# Patient Record
Sex: Female | Born: 1950 | Race: Black or African American | Hispanic: No | Marital: Married | Smoking: Former smoker
Health system: Southern US, Community
[De-identification: ages and names within clinical notes are randomized; demographics above are authoritative.]

## PROBLEM LIST (undated history)

## (undated) DIAGNOSIS — E785 Hyperlipidemia, unspecified: Secondary | ICD-10-CM

## (undated) DIAGNOSIS — D179 Benign lipomatous neoplasm, unspecified: Secondary | ICD-10-CM

## (undated) DIAGNOSIS — R9431 Abnormal electrocardiogram [ECG] [EKG]: Secondary | ICD-10-CM

## (undated) DIAGNOSIS — Z860101 Personal history of adenomatous and serrated colon polyps: Secondary | ICD-10-CM

## (undated) DIAGNOSIS — L309 Dermatitis, unspecified: Secondary | ICD-10-CM

## (undated) DIAGNOSIS — M7521 Bicipital tendinitis, right shoulder: Secondary | ICD-10-CM

## (undated) DIAGNOSIS — Z780 Asymptomatic menopausal state: Secondary | ICD-10-CM

## (undated) DIAGNOSIS — R7301 Impaired fasting glucose: Secondary | ICD-10-CM

## (undated) DIAGNOSIS — Z8639 Personal history of other endocrine, nutritional and metabolic disease: Secondary | ICD-10-CM

## (undated) DIAGNOSIS — Z86010 Personal history of colonic polyps: Secondary | ICD-10-CM

## (undated) DIAGNOSIS — R030 Elevated blood-pressure reading, without diagnosis of hypertension: Secondary | ICD-10-CM

## (undated) HISTORY — PX: COLONOSCOPY: SHX174

## (undated) HISTORY — DX: Personal history of adenomatous and serrated colon polyps: Z86.0101

## (undated) HISTORY — DX: Elevated blood-pressure reading, without diagnosis of hypertension: R03.0

## (undated) HISTORY — PX: BUNIONECTOMY: SHX129

## (undated) HISTORY — DX: Hyperlipidemia, unspecified: E78.5

## (undated) HISTORY — DX: Impaired fasting glucose: R73.01

## (undated) HISTORY — PX: TUBAL LIGATION: SHX77

## (undated) HISTORY — DX: Personal history of other endocrine, nutritional and metabolic disease: Z86.39

## (undated) HISTORY — DX: Benign lipomatous neoplasm, unspecified: D17.9

## (undated) HISTORY — DX: Personal history of colonic polyps: Z86.010

## (undated) HISTORY — DX: Dermatitis, unspecified: L30.9

## (undated) HISTORY — DX: Asymptomatic menopausal state: Z78.0

## (undated) HISTORY — DX: Abnormal electrocardiogram (ECG) (EKG): R94.31

## (undated) HISTORY — DX: Bicipital tendinitis, right shoulder: M75.21

## (undated) HISTORY — PX: POLYPECTOMY: SHX149

---

## 1986-12-12 HISTORY — PX: GANGLION CYST EXCISION: SHX1691

## 1988-12-12 HISTORY — PX: LIPOMA EXCISION: SHX5283

## 2010-11-18 HISTORY — PX: COLONOSCOPY: SHX174

## 2012-03-01 DIAGNOSIS — K635 Polyp of colon: Secondary | ICD-10-CM | POA: Insufficient documentation

## 2013-09-11 ENCOUNTER — Telehealth: Payer: Self-pay | Admitting: Family Medicine

## 2013-09-11 ENCOUNTER — Ambulatory Visit (INDEPENDENT_AMBULATORY_CARE_PROVIDER_SITE_OTHER): Payer: BC Managed Care – PPO | Admitting: Family Medicine

## 2013-09-11 ENCOUNTER — Encounter: Payer: Self-pay | Admitting: Family Medicine

## 2013-09-11 VITALS — BP 126/86 | HR 68 | Temp 98.0°F | Resp 16 | Ht 65.0 in | Wt 174.0 lb

## 2013-09-11 DIAGNOSIS — F458 Other somatoform disorders: Secondary | ICD-10-CM

## 2013-09-11 DIAGNOSIS — R09A2 Foreign body sensation, throat: Secondary | ICD-10-CM

## 2013-09-11 DIAGNOSIS — Z0000 Encounter for general adult medical examination without abnormal findings: Secondary | ICD-10-CM | POA: Insufficient documentation

## 2013-09-11 DIAGNOSIS — Z23 Encounter for immunization: Secondary | ICD-10-CM

## 2013-09-11 DIAGNOSIS — R0989 Other specified symptoms and signs involving the circulatory and respiratory systems: Secondary | ICD-10-CM

## 2013-09-11 MED ORDER — DEXLANSOPRAZOLE 60 MG PO CPDR: 60.0000 mg | DELAYED_RELEASE_CAPSULE | Freq: Every day | ORAL | Status: DC

## 2013-09-11 MED ORDER — ZOSTER VACCINE LIVE 19400 UNT/0.65ML ~~LOC~~ SOLR: 0.6500 mL | Freq: Once | SUBCUTANEOUS | Status: DC

## 2013-09-11 NOTE — Telephone Encounter (Signed)
Patient went over food list. Is there any way she can have just 1 cup of coffee a day? Is it okay to drink PowerAid Zero? Patient is going to check with her insurance company to see if it will cover the shingles vaccine.

## 2013-09-11 NOTE — Assessment & Plan Note (Signed)
Suspect atypical GERD/LPR. Dexilant 60mg  qd x 20 days (samples given). GERD diet handout reviewed and given to pt. Signs/symptoms to call or return for were reviewed and pt expressed understanding.

## 2013-09-11 NOTE — Assessment & Plan Note (Signed)
Flu vaccine and Tdap IM today. Printed rx for zostavax for pt to check w/insurer regarding coverage and then take to pharmacy for injection.

## 2013-09-11 NOTE — Progress Notes (Signed)
Office Note 09/11/2013  CC:  Chief Complaint  Patient presents with  . Establish Care  . throat    feels like something is stuck in throat    HPI:  Nicole Long is a 62 y.o. AA female who is here to establish care and discuss the feeling of something being in her throat. Patient's most recent primary MD: she maintains a PCP in [place], [place]Dr. Devota Pace.  She wants to have our office as a local standby in case of illness. Old records were not reviewed prior to or during today's visit.  Feels like she has something in her throat.  No pain.  Onset 5 d/a, without nasal congestion, sneezing, runny nose, or the feeling of PND.   No scratch or ST.  Gargled with warm salt water and vinegar has helped some but she sees nothing in the substance she spits out. No dysphagia or odynophagia.  No need to clear her throat.   Occ heartburn in the past but usually takes tums--never been on H2 blocker or PPI.  Past Medical History  Diagnosis Date  . Lipoma   . Postmenopausal   . Hx of adenomatous colonic polyps     Past Surgical History  Procedure Laterality Date  . Lipoma excision  1990  . Ganglion cyst excision  1988  . Tubal ligation    . Colonoscopy  11/18/2010    Hx of adenomatous colon polyps.  Several colonoscopies in [place].  Most recent via [facility] in [place] 11/18/2010.  [place] Leas  2010 and 2011    Family History  Problem Relation Age of Onset  . Cancer Mother   . Congestive Heart Failure Mother   . Cancer Sister   . Cancer Brother   . Thyroid disease Son   . Cancer Sister   . Stroke Sister   . Kidney disease Sister   . Kidney disease Brother   . Heart disease Brother     History   Social History  . Marital Status: Married    Spouse Name: N/A    Number of Children: N/A  . Years of Education: N/A   Occupational History  . Not on file.   Social History Main Topics  . Smoking status: Former Smoker    Types: Cigarettes    Quit date: 09/11/1985   . Smokeless tobacco: Never Used  . Alcohol Use: 0.6 oz/week    1 Glasses of wine per week     Comment: socially  . Drug Use: No  . Sexual Activity: Not on file   Other Topics Concern  . Not on file   Social History Narrative   Relocated to [facility] 2010 (has PMD in [place], [place]).   Married, has 9 y/o son and one step grandchild.   Retired Veterinary surgeon.   Occ alc, no tob/drugs.    Outpatient Encounter Prescriptions as of 09/11/2013  Medication Sig Dispense Refill  . dexlansoprazole (DEXILANT) 60 MG capsule Take 1 capsule (60 mg total) by mouth daily.  20 capsule  0  . Vitamin D, Ergocalciferol, (DRISDOL) 50000 UNITS CAPS capsule Take 50,000 Units by mouth every 7 (seven) days.      [name] zoster vaccine live, PF, (ZOSTAVAX) 78295 UNT/0.65ML injection Inject 19,400 Units into the skin once.  1 vial  0   No facility-administered encounter medications on file as of 09/11/2013.    No Known Allergies  ROS Review of Systems  Constitutional: Negative for fever, chills, appetite change and fatigue.  HENT: Negative for  ear pain, congestion, sore throat, neck stiffness and dental problem.   Eyes: Negative for discharge, redness and visual disturbance.  Respiratory: Negative for cough, chest tightness, shortness of breath and wheezing.   Cardiovascular: Negative for chest pain, palpitations and leg swelling.  Gastrointestinal: Negative for nausea, vomiting, abdominal pain, diarrhea and blood in stool.  Genitourinary: Negative for dysuria, urgency, frequency, hematuria, flank pain and difficulty urinating.  Musculoskeletal: Negative for myalgias, back pain, joint swelling and arthralgias.  Skin: Negative for pallor and rash.  Neurological: Negative for dizziness, speech difficulty, weakness and headaches.  Hematological: Negative for adenopathy. Does not bruise/bleed easily.  Psychiatric/Behavioral: Negative for confusion and sleep disturbance. The patient is not nervous/anxious.      PE; Blood  pressure 126/86, pulse 68, temperature 98 F (36.7 C), temperature source Temporal, resp. rate 16, height 5\' 5"  (1.651 m), weight 174 lb (78.926 kg), SpO2 98.00%. Gen: Alert, well appearing.  Patient is oriented to person, place, time, and situation. ENT: Ears: EACs clear, normal epithelium.  TMs with good light reflex and landmarks bilaterally.  Eyes: no injection, icteris, swelling, or exudate.  EOMI, PERRLA. Nose: no drainage or turbinate edema/swelling.  No injection or focal lesion.  Mouth: lips without lesion/swelling.  Oral mucosa pink and moist.  Dentition intact and without obvious caries or gingival swelling.  Oropharynx without erythema, exudate, or swelling.  Neck - No masses or thyromegaly or limitation in range of motion CV: RRR, no m/r/g.   LUNGS: CTA bilat, nonlabored resps, good aeration in all lung fields. ABD: soft, NT, ND, BS normal.  No hepatospenomegaly or mass.  No bruits. EXT: no clubbing, cyanosis, or edema.   Pertinent labs:  none  ASSESSMENT AND PLAN:   New pt: patient will maintain her PCP in [place] per her choice--gets CPE/labs with female physician (she is more comfortable with female physicians).  Globus sensation Suspect atypical GERD/LPR. Dexilant 60mg  qd x 20 days (samples given). GERD diet handout reviewed and given to pt. Signs/symptoms to call or return for were reviewed and pt expressed understanding.    Preventative health care Flu vaccine and Tdap IM today. Printed rx for zostavax for pt to check w/insurer regarding coverage and then take to pharmacy for injection.   An After Visit Summary was printed and given to the patient.  Return for f/u or call in 2-3 wks if not improved.

## 2013-09-13 NOTE — Telephone Encounter (Signed)
Patient aware that she can still drink her coffee daily, the diet that was given to her was just to show her things that can cause acid reflux to flare.  Patient also told me to add valtrex to her med list because she forgot to tell me that she takes that PRN.

## 2013-10-01 ENCOUNTER — Telehealth: Payer: Self-pay | Admitting: Family Medicine

## 2013-10-01 NOTE — Telephone Encounter (Signed)
Patient states that she is no better.  She said she called her GI dr about the "lump" in her throat and got an appointment for 11/01/13.  Patient was told to stay on dexilant until her appointment however she does not have anymore of the medication and her insurance said that it was not a covered drug.  I gave her enough samples to get her through until her GI appointment.

## 2013-10-02 NOTE — Telephone Encounter (Signed)
Noted  

## 2013-10-07 ENCOUNTER — Telehealth: Payer: Self-pay | Admitting: Family Medicine

## 2013-10-07 NOTE — Telephone Encounter (Signed)
Patient states that she is taking dexilant daily and pharmacist told her to add pepcid for acid reduction.  Patient wanted to know if there was anything else she could do for relief until her GI appointment 11/01/13.  Please advise.

## 2013-10-07 NOTE — Telephone Encounter (Signed)
I think what she is doing is great, nothing further to add at this time except make sure she's following the reflux diet (avoidance of foods that are known to cause reflux more than others).  thx

## 2013-10-07 NOTE — Telephone Encounter (Signed)
Patient aware.

## 2014-04-04 ENCOUNTER — Ambulatory Visit (INDEPENDENT_AMBULATORY_CARE_PROVIDER_SITE_OTHER): Payer: BC Managed Care – PPO | Admitting: Nurse Practitioner

## 2014-04-04 ENCOUNTER — Encounter: Payer: Self-pay | Admitting: Nurse Practitioner

## 2014-04-04 VITALS — BP 130/80 | HR 62 | Temp 97.7°F | Resp 18 | Ht 65.0 in | Wt 170.0 lb

## 2014-04-04 DIAGNOSIS — S0993XA Unspecified injury of face, initial encounter: Secondary | ICD-10-CM

## 2014-04-04 DIAGNOSIS — R030 Elevated blood-pressure reading, without diagnosis of hypertension: Secondary | ICD-10-CM

## 2014-04-04 DIAGNOSIS — R0989 Other specified symptoms and signs involving the circulatory and respiratory systems: Secondary | ICD-10-CM | POA: Insufficient documentation

## 2014-04-04 DIAGNOSIS — S199XXA Unspecified injury of neck, initial encounter: Secondary | ICD-10-CM

## 2014-04-04 DIAGNOSIS — R6889 Other general symptoms and signs: Secondary | ICD-10-CM

## 2014-04-04 DIAGNOSIS — S0991XA Unspecified injury of ear, initial encounter: Secondary | ICD-10-CM | POA: Insufficient documentation

## 2014-04-04 NOTE — Progress Notes (Signed)
Pre visit review using our clinic review tool, if applicable. No additional management support is needed unless otherwise documented below in the visit note. 

## 2014-04-04 NOTE — Progress Notes (Signed)
Subjective:    Patient ID: Nicole Long, female    DOB: September 25, 1951, 63 y.o.   MRN: [id number]  Otalgia  There is pain in the right ear. This is a new problem. The current episode started yesterday (put ear piece of eye glasses in canal to satisfy an itch). The problem has been gradually improving. There has been no fever. The pain is mild. Associated symptoms include headaches (mild, top of head). Pertinent negatives include no abdominal pain, coughing, diarrhea, drainage, hearing loss, neck pain, rash, rhinorrhea or sore throat. She has tried nothing for the symptoms.  Hypertension This is a recurrent problem. The current episode started yesterday. The problem is unchanged. Associated symptoms include headaches (mild, top of head). Pertinent negatives include no anxiety, blurred vision, chest pain, malaise/fatigue, neck pain, palpitations, peripheral edema, PND or shortness of breath. Associated agents: has had more salt than usual in last week. Risk factors for coronary artery disease include family history. Past treatments include nothing. labs reviewed from PCP in [place] dated March, 2015: TSH, CBC, CMET nml.      Review of Systems  Constitutional: Negative for fever, chills, malaise/fatigue, activity change, appetite change and fatigue.  HENT: Positive for ear pain. Negative for congestion, hearing loss, postnasal drip, rhinorrhea, sinus pressure, sore throat and voice change.   Eyes: Negative for blurred vision, redness, itching and visual disturbance.  Respiratory: Negative for cough, shortness of breath and wheezing.   Cardiovascular: Negative for chest pain, palpitations, leg swelling and PND.  Gastrointestinal: Negative for abdominal pain and diarrhea.  Musculoskeletal: Negative for back pain and neck pain.  Skin: Negative for rash.  Neurological: Positive for headaches (mild, top of head). Negative for dizziness and light-headedness.  Hematological: Negative for adenopathy.         Objective:   Physical Exam  Vitals reviewed. Constitutional: She is oriented to person, place, and time. She appears well-developed and well-nourished. No distress.  HENT:  Head: Normocephalic and atraumatic.  Left Ear: External ear normal.  Nose: Nose normal.  Mouth/Throat: Oropharynx is clear and moist. No oropharyngeal exudate.  2 mm red lesion in canal. No swelling or drainage. Bilat TM intact, bones visible.  Eyes: Conjunctivae are normal. Right eye exhibits no discharge. Left eye exhibits no discharge.  Neck: Normal range of motion. Neck supple. No thyromegaly present.  Cardiovascular: Normal rate, regular rhythm and normal heart sounds.   No murmur heard. 16 to 20 point difference in blood pressure between arms  Pulmonary/Chest: Effort normal and breath sounds normal. No respiratory distress. She has no wheezes. She has no rales.  Lymphadenopathy:    She has no cervical adenopathy.  Neurological: She is alert and oriented to person, place, and time.  Skin: Skin is warm and dry.  Psychiatric: She has a normal mood and affect. Her behavior is normal. Thought content normal.          Assessment & Plan:  1. Elevated blood pressure reading without diagnosis of hypertension Reviewed labs from PCP in [place]. CBC, TSH, CMET nml. Pt to complete BP log at home using auto machine, taking at least 3 readings in ea arm. Restrict salt to 2400 mg day Walk 30 minutes daily DASH diet F/u 1 wk  2. Unequal blood pressure in upper extremities Pt to complete BP log at home using auto machine, taking at least 3 readings in ea arm. F/u in 1 week.  3. Trauma of ear canal Small lesion. No swelling or drainage. No intervention.

## 2014-04-04 NOTE — Patient Instructions (Addendum)
You have a small scrape in the ear canal. It will heal. Avoid putting things in your ear. The headache you have experienced may be due to elevated blood pressure. It is a bit high today. Limit salt to 2400 mg daily. Read about DASH diet. Walk 30 minutes daily. Follow up in 1 week for blood pressure evaluation. Bring blood pressure log with you. Take at least 3 readings in each arm. When monitoring blood pressure, take at same time of day for consistency. Feet flat on floor, cuff at level of heart, sit for 5 minutes before taking reading. How to Take Your Blood Pressure  These instructions are only for electronic home blood pressure machines. You will need:   An automatic or semi-automatic blood pressure machine.  Fresh batteries for the blood pressure machine. HOW DO I USE THESE TOOLS TO CHECK MY BLOOD PRESSURE?   There are 2 numbers that make up your blood pressure. For example: 120/80.  The first number (120 in our example) is called the "systolic pressure." It is a measure of the pressure in your blood vessels when your heart is pumping blood.  The second number (80 in our example) is called the "diastolic pressure." It is a measure of the pressure in your blood vessels when your heart is resting between beats.  Before you buy a home blood pressure machine, check the size of your arm so you can buy the right size cuff. Here is how to check the size of your arm:  Use a tape measure that shows both inches and centimeters.  Wrap the tape measure around the middle upper part of your arm. You may need someone to help you measure right.  Write down your arm measurement in both inches and centimeters.  To measure your blood pressure right, it is important to have the right size cuff.  If your arm is up to 13 inches (37 to 34 centimeters), get an adult cuff size.  If your arm is 13 to 17 inches (35 to 44 centimeters), get a large adult cuff size.  If your arm is 17 to 20 inches (45 to 52  centimeters), get an adult thigh cuff.  Try to rest or relax for at least 30 minutes before you check your blood pressure.  Do not smoke.  Do not have any drinks with caffeine, such as:  Pop.  Coffee.  Tea.  Check your blood pressure in a quiet room.  Sit down and stretch out your arm on a table. Keep your arm at about the level of your heart. Let your arm relax. GETTING BLOOD PRESSURE READINGS  Make sure you remove any tight-fighting clothing from your arm. Wrap the cuff around your upper arm. Wrap it just above the bend, and above where you felt the pulse. You should be able to slip a finger between the cuff and your arm. If you cannot slip a finger in the cuff, it is too tight and should be removed and rewrapped.  Some units requires you to manually pump up the arm cuff.  Automatic units inflate the cuff when you press a button.  Cuff deflation is automatic in both models.  After the cuff is inflated, the unit measures your blood pressure and pulse. The readings are displayed on a monitor. Hold still and breathe normally while the cuff is inflated.  Getting a reading takes less than a minute.  Some models store readings in a memory. Some provide a printout of readings.  Get  readings at different times of the day. You should wait at least 5 minutes between readings. Take readings with you to your next doctor's visit. Document Released: 11/10/2008 Document Revised: 02/20/2012 Document Reviewed: 11/10/2008 [facility] Patient Information 2014 [place].

## 2014-04-08 ENCOUNTER — Encounter: Payer: Self-pay | Admitting: *Deleted

## 2014-04-08 LAB — LIPID PANEL
CHOLESTEROL: 213 mg/dL — AB (ref 0–200)
HDL: 75 mg/dL — AB (ref 35–70)
LDL (calc): 123
Triglycerides: 75
VLDL Cholesterol Cal: 15

## 2014-04-08 LAB — CBC
HGB: 12.3 g/dL
Hematocrit: 38.2 % (ref 34.8–46)
MCH: 30
MCHC: 32
MCV (KUC): 94 fL (ref 78–100)
MPV (KUC): 9.1 fL (ref 7.8–11)
PLATELETS: 232 10*3/uL
RBC: 4.07
RDW: 12
WBC: 4

## 2014-04-08 LAB — COMPLETE METABOLIC PANEL WITH GFR
A/G RATIO: 1.5
ALBUMIN: 4.4
ALK PHOS: 75 U/L
ALT: 9 U/L (ref 7–35)
ANION GAP: 13
AST: 17 U/L
BILIRUBIN, TOTAL: 0.6
BUN: 12 mg/dL (ref 4–21)
CO2: 26 mmol/L
Calcium: 9.5 mg/dL
Chloride: 105 mmol/L
Creat: 0.7
GFR, EST AFRICAN AMERICAN: 60
GFR, Est Non African American: 60
Globulin: 2.9
Glucose: 101
Potassium: 5 mmol/L
Sodium: 144 mmol/L (ref 137–147)
TOTAL PROTEIN: 7.3 g/dL

## 2014-04-08 LAB — VITAMIN D 25 HYDROXY (VIT D DEFICIENCY, FRACTURES): VIT D 25 HYDROXY: 37.7

## 2014-04-11 ENCOUNTER — Ambulatory Visit (INDEPENDENT_AMBULATORY_CARE_PROVIDER_SITE_OTHER): Payer: BC Managed Care – PPO | Admitting: Family Medicine

## 2014-04-11 ENCOUNTER — Encounter: Payer: Self-pay | Admitting: Family Medicine

## 2014-04-11 VITALS — BP 164/104 | HR 92 | Temp 98.1°F | Resp 18 | Ht 65.0 in | Wt 168.0 lb

## 2014-04-11 DIAGNOSIS — R9431 Abnormal electrocardiogram [ECG] [EKG]: Secondary | ICD-10-CM

## 2014-04-11 DIAGNOSIS — I10 Essential (primary) hypertension: Secondary | ICD-10-CM

## 2014-04-11 MED ORDER — HYDROCHLOROTHIAZIDE 25 MG PO TABS: 25.0000 mg | ORAL_TABLET | Freq: Every day | ORAL | Status: DC

## 2014-04-11 NOTE — Progress Notes (Signed)
OFFICE VISIT  04/14/2014   CC:  Chief Complaint  Patient presents with  . Follow-up    HTN   HPI:    Patient is a 63 y.o. African-American female who presents for f/u elevated bp's, also arm bp asymmetry noted last week. Has been checking bp's in each arm every other day since then and still getting avg 140-150 syst on left and avg in 150s on right.  Some checks show R and L arm syst same.  Diastolics same in each arm. She is trying to do more walking and more indoor biking. She denies CP/SOB/Vision changes, dizziness, or palpitations.  No use of OTC decongestant. She admits to being quite worried about her bp lately.  GERD much improved with dietary changes and use of PPI.  Past Medical History  Diagnosis Date  . Lipoma   . Postmenopausal   . Hx of adenomatous colonic polyps     Past Surgical History  Procedure Laterality Date  . Lipoma excision  1990  . Ganglion cyst excision  1988  . Tubal ligation    . Colonoscopy  11/18/2010    Hx of adenomatous colon polyps.  Several colonoscopies in [place].  Most recent via [facility] in [place] 11/18/2010.  [place] Anes  2010 and 2011   History   Social History Narrative   Relocated to [facility] 2010 (has PMD in [place], [place]).   Married, has 57 y/o son and one step grandchild.   Retired Social worker.   Occ alc, no tob/drugs.    Outpatient Prescriptions Prior to Visit  Medication Sig Dispense Refill  . valACYclovir (VALTREX) 1000 MG tablet Take 1,000 mg by mouth as needed.      . Vitamin D, Ergocalciferol, (DRISDOL) 50000 UNITS CAPS capsule Take 50,000 Units by mouth every 7 (seven) days.      [name] dexlansoprazole (DEXILANT) 60 MG capsule Take 1 capsule (60 mg total) by mouth daily.  20 capsule  0  . zoster vaccine live, PF, (ZOSTAVAX) 27062 UNT/0.65ML injection Inject 19,400 Units into the skin once.  1 vial  0   No facility-administered medications prior to visit.    No Known Allergies  ROS As per HPI  PE: Blood pressure  164/104, pulse 92, temperature 98.1 F (36.7 C), temperature source Temporal, resp. rate 18, height 5\' 5"  (1.651 m), weight 168 lb (76.204 kg), SpO2 99.00%. Gen: Alert, well appearing.  Patient is oriented to person, place, time, and situation. BJS:EGBT: no injection, icteris, swelling, or exudate.  EOMI, PERRLA. Mouth: lips without lesion/swelling.  Oral mucosa pink and moist. Oropharynx without erythema, exudate, or swelling.  Neck: supple/nontender.  No LAD, mass, or TM.  Carotid pulses 2+ bilaterally, without bruits. CV: RRR, no m/r/g.   LUNGS: CTA bilat, nonlabored resps, good aeration in all lung fields. No supraclavicular or infraclavicular bruit.  Radial pulses 1+ bilat--no delay in either arm.  LABS:  12 lead EKG today: no prior available for comparison at this time:  Sinus brady, rate 58, TWI all precordial leads, with 50mm horizontal ST seg depression in V5  IMPRESSION AND PLAN:  HTN (hypertension), benign With abnormal EKG today. I discussed this case with Dr. Martinique with [facility] and he agreed that since pt was asymptomatic nothing urgently needed to be done to f/u her abnormal EKG. These changes COULD also be attributed to her HTN. He recommended she be referred to them non-urgently for further eval, which I have ordered today. I also started pt on hctz 25mg  qd.  An After Visit Summary was printed and given to the patient.  FOLLOW UP: Return in about 2 weeks (around 04/25/2014) for f/u HTN.

## 2014-04-11 NOTE — Progress Notes (Signed)
Pre visit review using our clinic review tool, if applicable. No additional management support is needed unless otherwise documented below in the visit note. 

## 2014-04-14 ENCOUNTER — Telehealth: Payer: Self-pay | Admitting: Family Medicine

## 2014-04-14 ENCOUNTER — Encounter: Payer: Self-pay | Admitting: Family Medicine

## 2014-04-14 DIAGNOSIS — I10 Essential (primary) hypertension: Secondary | ICD-10-CM | POA: Insufficient documentation

## 2014-04-14 DIAGNOSIS — R9431 Abnormal electrocardiogram [ECG] [EKG]: Secondary | ICD-10-CM | POA: Insufficient documentation

## 2014-04-14 NOTE — Telephone Encounter (Signed)
Patient states that the HCTZ makes her feel fatigued and lightheaded.  Patient's BP seems to be okay at around 129/81.  Please advise.

## 2014-04-14 NOTE — Telephone Encounter (Signed)
Patient also said that she forgot to have you check her ear because Layne had seen a place in it so she wondered if that could have anything to do with her symptoms.

## 2014-04-14 NOTE — Telephone Encounter (Signed)
Relevant patient education assigned to patient using Emmi. ° °

## 2014-04-14 NOTE — Assessment & Plan Note (Signed)
With abnormal EKG today. I discussed this case with Dr. Martinique with [facility] and he agreed that since pt was asymptomatic nothing urgently needed to be done to f/u her abnormal EKG. These changes COULD also be attributed to her HTN. He recommended she be referred to them non-urgently for further eval, which I have ordered today. I also started pt on hctz 25mg  qd.

## 2014-04-14 NOTE — Telephone Encounter (Signed)
Noted  

## 2014-04-14 NOTE — Telephone Encounter (Signed)
Patient states that she took medication at lunch today and the symptoms weren't near as bad so she states that she will give it a few more days and see if the symptoms continue to improve.   Patient also states that she just realized that she and her husband had been eating a lot of rotisserie chicken and she didn't realize how high in sodium that was so she wondered if that could have played a role in her sudden increase in BP.  Patient's cutting that out of her diet and will watch sodium intake from now on.

## 2014-04-14 NOTE — Telephone Encounter (Signed)
Ask her if the fatigue and lightheadedness started right after starting HCTZ. If so, then it is likely the med causing the sx's and she should stop the med and I'll replace it with a different one. Let me know.-thx

## 2014-04-17 ENCOUNTER — Telehealth: Payer: Self-pay

## 2014-04-17 NOTE — Telephone Encounter (Signed)
Spoke with pt, advised message from Dr Anitra Lauth. She will follow up with [place] Monday, possibly sooner if worse.

## 2014-04-17 NOTE — Telephone Encounter (Signed)
Stop HCTZ.  No o/v needed. Wait through the weekend to see if she starts feeling back to her normal baseline, ok to just monitor bp and take no bp med for these days.  Reassure her it won't harm her to do this. Call Monday and report to [place] how she's feeling and if back to baseline then I will start a new bp med for her.-thx

## 2014-04-17 NOTE — Telephone Encounter (Signed)
Spoke with pt, she states the HCTZ is still making her feel bad. She feels tired and short of breath. She would like to know what to do next? She states she can come in for an office visit today if you would like. Please advise.

## 2014-04-23 ENCOUNTER — Telehealth: Payer: Self-pay | Admitting: Family Medicine

## 2014-04-23 NOTE — Telephone Encounter (Signed)
Patient states that her BP is doing well without medications at this time.  She has monitored them on a daily basis and she plans to still see Cardiology.  BP, Pulse readings are as follows: 139/84, 81 112/79, 95 116/75, 85 124/76, 79 125/71, 71

## 2014-04-23 NOTE — Telephone Encounter (Signed)
Noted  

## 2014-04-30 ENCOUNTER — Encounter: Payer: Self-pay | Admitting: Family Medicine

## 2014-04-30 ENCOUNTER — Ambulatory Visit (INDEPENDENT_AMBULATORY_CARE_PROVIDER_SITE_OTHER): Payer: BC Managed Care – PPO | Admitting: Family Medicine

## 2014-04-30 VITALS — BP 160/102 | HR 69 | Temp 98.2°F | Resp 16 | Ht 65.0 in | Wt 164.0 lb

## 2014-04-30 DIAGNOSIS — F4321 Adjustment disorder with depressed mood: Secondary | ICD-10-CM

## 2014-04-30 DIAGNOSIS — F430 Acute stress reaction: Principal | ICD-10-CM

## 2014-04-30 DIAGNOSIS — R4589 Other symptoms and signs involving emotional state: Secondary | ICD-10-CM

## 2014-04-30 DIAGNOSIS — F411 Generalized anxiety disorder: Secondary | ICD-10-CM

## 2014-04-30 MED ORDER — LORAZEPAM 0.5 MG PO TABS: ORAL_TABLET | ORAL | Status: DC

## 2014-04-30 NOTE — Progress Notes (Signed)
Pre visit review using our clinic review tool, if applicable. No additional management support is needed unless otherwise documented below in the visit note. 

## 2014-04-30 NOTE — Progress Notes (Signed)
OFFICE NOTE  04/30/2014  CC:  Chief Complaint  Patient presents with  . Insomnia    pt's sister passed away     HPI: Patient is a 63 y.o. African-American female who is here for grief rxn--sister died last week.  Brother is ill, in hosp.  She is stressing about riding a [place] to [place] for her sisters funeral. Feels lump in back of throat constantly, uncomfortable feeling across chest, not sleeping well, crying a lot the last 24h.  No exertional CP, no SOB, no palpitations, no dizziness. Very tired.  Since d/c of her bp med she has monitored bp occasionally and numbers have been normal. Exercise habits improved.  Has appt 05/19/14 with cardiologist.  Pertinent PMH:  Past medical, surgical, social, and family history reviewed and no changes are noted since last office visit.  MEDS:  Outpatient Prescriptions Prior to Visit  Medication Sig Dispense Refill  . valACYclovir (VALTREX) 1000 MG tablet Take 1,000 mg by mouth as needed.      . Vitamin D, Ergocalciferol, (DRISDOL) 50000 UNITS CAPS capsule Take 50,000 Units by mouth every 7 (seven) days.      [name] dexlansoprazole (DEXILANT) 60 MG capsule Take 1 capsule (60 mg total) by mouth daily.  20 capsule  0  . hydrochlorothiazide (HYDRODIURIL) 25 MG tablet Take 1 tablet (25 mg total) by mouth daily.  30 tablet  1  . zoster vaccine live, PF, (ZOSTAVAX) 51761 UNT/0.65ML injection Inject 19,400 Units into the skin once.  1 vial  0   No facility-administered medications prior to visit.    PE: Blood pressure 160/102, pulse 69, temperature 98.2 F (36.8 C), temperature source Temporal, resp. rate 16, height 5\' 5"  (1.651 m), weight 164 lb (74.39 kg), SpO2 100.00%. Gen: Alert, well appearing.  Patient is oriented to person, place, time, and situation. YWV:PXTG: no injection, icteris, swelling, or exudate.  EOMI, PERRLA. Mouth: lips without lesion/swelling.  Oral mucosa pink and moist. Oropharynx without erythema, exudate, or  swelling.  CV: RRR, no m/r/g.   LUNGS: CTA bilat, nonlabored resps, good aeration in all lung fields.   IMPRESSION AND PLAN:  1) Acute stress rxn/grief reaction. Reassured patient that all sx's she is having are not life threatening and are going to gradually calm down/dissipate with time.  In the meantime, continue to monitor bp's, call if consistently >150 syst or 100 diast. Ativan 0.5mg , 1-2 tid prn for short term use: #30 today, RF x 1.  2) HTN; home bp monitoring is fine since she d'c'd her hctz (side effects).  An After Visit Summary was printed and given to the patient.  FOLLOW UP: 2-3 wks

## 2014-05-19 ENCOUNTER — Encounter: Payer: Self-pay | Admitting: Internal Medicine

## 2014-05-19 ENCOUNTER — Ambulatory Visit (INDEPENDENT_AMBULATORY_CARE_PROVIDER_SITE_OTHER): Payer: BC Managed Care – PPO | Admitting: Internal Medicine

## 2014-05-19 VITALS — BP 150/80 | HR 72 | Ht 65.0 in | Wt 168.0 lb

## 2014-05-19 DIAGNOSIS — I10 Essential (primary) hypertension: Secondary | ICD-10-CM

## 2014-05-19 DIAGNOSIS — R9431 Abnormal electrocardiogram [ECG] [EKG]: Secondary | ICD-10-CM

## 2014-05-19 NOTE — Progress Notes (Signed)
HPI Patient is a 63 yo who is referred for evaluation of HTN and abnormal EKG The patinet has no known history of CAD Denies CP  No SOB>  Uses stationalry bike.   EKG on 05/04/2023 showed SB 58 with T wave inversion in V1-V5 No Known Allergies  Current Outpatient Prescriptions  Medication Sig Dispense Refill  . aspirin 81 MG tablet Take 81 mg by mouth daily.      . cholecalciferol (VITAMIN D) 1000 UNITS tablet Take 1,000 Units by mouth daily.      [name] dexlansoprazole (DEXILANT) 60 MG capsule PT NOT TAKING MED > SHE HAS MADE CHANGES TO HER DIET      . LORazepam (ATIVAN) 0.5 MG tablet 1-2 tabs po tid prn anxiety  30 tablet  1  . valACYclovir (VALTREX) 1000 MG tablet Take 1,000 mg by mouth as needed.       No current facility-administered medications for this visit.    Past Medical History  Diagnosis Date  . Lipoma   . Postmenopausal   . Hx of adenomatous colonic polyps   . Hypertension     Past Surgical History  Procedure Laterality Date  . Lipoma excision  1990  . Ganglion cyst excision  1988  . Tubal ligation    . Colonoscopy  11/18/2010    Hx of adenomatous colon polyps.  Several colonoscopies in [place].  Most recent via [facility] in [place] 11/18/2010.  [place] Anes  2010 and 2011    Family History  Problem Relation Age of Onset  . Cancer Mother   . Congestive Heart Failure Mother   . Cancer Sister   . Stroke Sister     passed away May 03, 2014  . Cancer Brother   . Thyroid disease Son   . Cancer Sister   . Stroke Sister   . Kidney disease Sister   . Kidney disease Brother   . Heart disease Brother     pacemaker    History   Social History  . Marital Status: Married    Spouse Name: N/A    Number of Children: N/A  . Years of Education: N/A   Occupational History  . Not on file.   Social History Main Topics  . Smoking status: Former Smoker    Types: Cigarettes    Quit date: 09/11/1985  . Smokeless tobacco: Never Used  . Alcohol Use: 0.6 oz/week    1 Glasses  of wine per week     Comment: socially  . Drug Use: No  . Sexual Activity: Not on file   Other Topics Concern  . Not on file   Social History Narrative   Relocated to [facility] 2010 (has PMD in [place], [place]).   Married, has 52 y/o son and one step grandchild.   Retired Social worker.   Occ alc, no tob/drugs.    Review of Systems:  All systems reviewed.  They are negative to the above problem except as previously stated.  Vital Signs: BP 150/80  Pulse 72  Ht 5\' 5"  (1.651 m)  Wt 168 lb (76.204 kg)  BMI 27.96 kg/m2  Physical Exam Patinet is in NAD HEENT:  Normocephalic, atraumatic. EOMI, PERRLA.  Neck: JVP is normal.  No bruits.  Lungs: clear to auscultation. No rales no wheezes.  Heart: Regular rate and rhythm. Normal S1, S2. No S3.   No significant murmurs. PMI not displaced.  Abdomen:  Supple, nontender. Normal bowel sounds. No masses. No hepatomegaly.  Extremities:   Good distal  pulses throughout. No lower extremity edema.  Musculoskeletal :moving all extremities.  Neuro:   alert and oriented x3.  CN II-XII grossly intact.   Assessment and Plan:  1.  Abnormal EKG  Will try to track down old EKG  I am not convinced of active symptoms.    2.  HTN  BP isi not optimal  Will need to be followed  I would not make changes based on 1 value.

## 2014-05-19 NOTE — Patient Instructions (Signed)
Your physician recommends that you continue on your current medications as directed. Please refer to the Current Medication list given to you today.  Your physician recommends that you schedule a follow-up appointment AS NEEDED WITH DR.ROSS.   

## 2014-05-20 ENCOUNTER — Telehealth: Payer: Self-pay | Admitting: Internal Medicine

## 2014-05-20 NOTE — Telephone Encounter (Signed)
ROI faxed to [facility] @ [phone]

## 2014-06-04 ENCOUNTER — Telehealth: Payer: Self-pay | Admitting: Internal Medicine

## 2014-06-04 NOTE — Telephone Encounter (Signed)
Records rec From [place] gave to Luisa Dago

## 2014-06-17 ENCOUNTER — Telehealth: Payer: Self-pay | Admitting: *Deleted

## 2014-06-17 DIAGNOSIS — I10 Essential (primary) hypertension: Secondary | ICD-10-CM

## 2014-06-17 DIAGNOSIS — R9431 Abnormal electrocardiogram [ECG] [EKG]: Secondary | ICD-10-CM

## 2014-06-17 NOTE — Telephone Encounter (Signed)
Let message x2 to call office. Want to inform patient that Dr. [name] Challenger wants pt to have echo. Message sent to [facility] to schedule echo.

## 2014-06-18 ENCOUNTER — Telehealth: Payer: Self-pay | Admitting: Internal Medicine

## 2014-06-18 NOTE — Telephone Encounter (Signed)
Follow up:      Per pt leavng her home for a car appt in about an hr and a half.     And aksing for RN Michalene to please give her a call back around

## 2014-06-18 NOTE — Telephone Encounter (Signed)
New message     Pt states she is to call Michalene today.

## 2014-06-19 NOTE — Telephone Encounter (Signed)
Spoke with patient. She had the EKGs sent to Dr. Idelle Leech office. They were faxed to Dr. [name] Challenger for her review.

## 2014-06-19 NOTE — Telephone Encounter (Signed)
Follow up      Patient is returning Michalene's call from a couple of days ago.  Pt request you call her this am please.

## 2014-06-19 NOTE — Telephone Encounter (Signed)
Patient would like these to be reviewed by Dr. [name] Challenger prior to scheduling an echocardiogram.

## 2014-06-27 NOTE — Telephone Encounter (Signed)
I have reviewed old EKGs  Changes had been present I still would like her to get an echo to evaluate

## 2014-06-30 NOTE — Telephone Encounter (Signed)
Called patient to inform Dr. [name] Challenger wants her to have an echo. She is agreeable.   Advised [facility] would be in touch to schedule.

## 2014-07-02 ENCOUNTER — Telehealth: Payer: Self-pay | Admitting: Internal Medicine

## 2014-07-02 NOTE — Telephone Encounter (Signed)
New message    Patient wants to discuss echo that schedule on 7/24 . Would like a call back today.

## 2014-07-02 NOTE — Telephone Encounter (Signed)
Patient will have a $400 payment as her portion of the echo. She wants to know if there is somewhere else she can go that her portion of the cost would be less. Her echo is Friday at 8:30am She has concerns about paying this.   I advised her the hospital copay would likely be higher, and that i didn't know of any other facilities that would be less. Staff message sent to Mack Guise, [facility] to ask if she has any recommendations.

## 2014-07-04 ENCOUNTER — Ambulatory Visit (HOSPITAL_COMMUNITY): Payer: BC Managed Care – PPO | Attending: Internal Medicine | Admitting: Cardiology

## 2014-07-04 DIAGNOSIS — R9431 Abnormal electrocardiogram [ECG] [EKG]: Secondary | ICD-10-CM | POA: Insufficient documentation

## 2014-07-04 DIAGNOSIS — I517 Cardiomegaly: Secondary | ICD-10-CM | POA: Insufficient documentation

## 2014-07-04 DIAGNOSIS — Z87891 Personal history of nicotine dependence: Secondary | ICD-10-CM | POA: Insufficient documentation

## 2014-07-04 DIAGNOSIS — I10 Essential (primary) hypertension: Secondary | ICD-10-CM | POA: Insufficient documentation

## 2014-07-04 DIAGNOSIS — I059 Rheumatic mitral valve disease, unspecified: Secondary | ICD-10-CM | POA: Insufficient documentation

## 2014-07-04 HISTORY — PX: TRANSTHORACIC ECHOCARDIOGRAM: SHX275

## 2014-07-04 NOTE — Progress Notes (Signed)
Echo performed. 

## 2014-07-16 ENCOUNTER — Encounter: Payer: Self-pay | Admitting: Family Medicine

## 2014-10-15 ENCOUNTER — Telehealth: Payer: Self-pay | Admitting: Internal Medicine

## 2014-10-15 NOTE — Telephone Encounter (Signed)
New Message  Pt called to cancel appt// Request a call back to discuss why she is canceling appt//sr

## 2014-10-15 NOTE — Telephone Encounter (Signed)
Pt would like Michalene RN to return the call since she knows what is going on prior, pt was told dr/nurse will be in tomorrow and I will forward the message, pt accepting of plan.

## 2014-10-16 NOTE — Telephone Encounter (Signed)
lmtcb

## 2014-10-20 ENCOUNTER — Ambulatory Visit: Payer: BC Managed Care – PPO | Admitting: Internal Medicine

## 2014-11-05 DIAGNOSIS — K219 Gastro-esophageal reflux disease without esophagitis: Secondary | ICD-10-CM | POA: Insufficient documentation

## 2015-04-27 ENCOUNTER — Telehealth: Payer: Self-pay | Admitting: Family Medicine

## 2015-04-27 NOTE — Telephone Encounter (Signed)
LM w/ pt's husband to have her call back about mammogram.

## 2016-03-02 DIAGNOSIS — Z803 Family history of malignant neoplasm of breast: Secondary | ICD-10-CM | POA: Diagnosis not present

## 2016-03-02 DIAGNOSIS — Z1231 Encounter for screening mammogram for malignant neoplasm of breast: Secondary | ICD-10-CM | POA: Diagnosis not present

## 2016-03-31 ENCOUNTER — Encounter: Payer: Self-pay | Admitting: Family Medicine

## 2016-03-31 ENCOUNTER — Ambulatory Visit (INDEPENDENT_AMBULATORY_CARE_PROVIDER_SITE_OTHER): Payer: Medicare Other | Admitting: Family Medicine

## 2016-03-31 ENCOUNTER — Ambulatory Visit: Payer: Self-pay | Admitting: Family Medicine

## 2016-03-31 VITALS — BP 168/88 | HR 97 | Temp 98.1°F | Resp 16 | Ht 65.5 in | Wt 179.2 lb

## 2016-03-31 DIAGNOSIS — R030 Elevated blood-pressure reading, without diagnosis of hypertension: Secondary | ICD-10-CM

## 2016-03-31 DIAGNOSIS — H6981 Other specified disorders of Eustachian tube, right ear: Secondary | ICD-10-CM

## 2016-03-31 NOTE — Progress Notes (Signed)
Pre visit review using our clinic review tool, if applicable. No additional management support is needed unless otherwise documented below in the visit note. 

## 2016-03-31 NOTE — Patient Instructions (Addendum)
Check your blood pressure and heart rate every day for the next 2 weeks and write them down. Bring these with you to your next office visit with me in 2 wks.  Buy otc nasal spray: flonase (brand name).  Fluticasone propionate is the generic name.  Use as directed on packaging.

## 2016-03-31 NOTE — Progress Notes (Signed)
OFFICE VISIT  03/31/2016   CC:  Chief Complaint  Patient presents with  . Ear Fullness    right ear x 1 week   HPI:    Patient is a 65 y.o. African-American female who presents for R ear feeling clogged for about a week. Some debrox use helped a little--she thinks?.  Denies significant sensation of decreased hearing. No ear pain.  + Nasal sinus congestion somewhat lately.  OTC decongestant helped briefly.  Last decongestant use was last night about 10 hours ago.  No bp checks outside of office.  She does have a bp cuff at home.  No HAs, fatigue, SOB, dizziness, or CP. She admits she has not been monitoring her bp since seeing the cardiologist for this issue a couple years ago (Dr. [name] Challenger).  Past Medical History  Diagnosis Date  . Lipoma   . Postmenopausal   . Hx of adenomatous colonic polyps   . Hypertension   . Abnormal EKG     Inverted T waves in precordial leads: unchanged since 2006--this is her baseline    Past Surgical History  Procedure Laterality Date  . Lipoma excision  1990  . Ganglion cyst excision  1988  . Tubal ligation    . Colonoscopy  11/18/2010    Hx of adenomatous colon polyps.  Several colonoscopies in [place].  Most recent via [facility] in [place] 11/18/2010.  . Bunionectomy  2010 and 2011  . Transthoracic echocardiogram  07/04/14    Normal LV size and wall thickness, EF normal, grade I diast dysfxn    Outpatient Prescriptions Prior to Visit  Medication Sig Dispense Refill  . aspirin 81 MG tablet Take 81 mg by mouth daily.    . cholecalciferol (VITAMIN D) 1000 UNITS tablet Take 1,000 Units by mouth daily. Reported on 03/31/2016    . LORazepam (ATIVAN) 0.5 MG tablet 1-2 tabs po tid prn anxiety 30 tablet 1  . valACYclovir (VALTREX) 1000 MG tablet Take 1,000 mg by mouth as needed.    [name] dexlansoprazole (DEXILANT) 60 MG capsule Reported on 03/31/2016     No facility-administered medications prior to visit.    No Known Allergies  ROS As per  HPI  PE: Blood pressure 168/88, pulse 97, temperature 98.1 F (36.7 C), temperature source Oral, resp. rate 16, height 5' 5.5" (1.664 m), weight 179 lb 4 oz (81.307 kg), SpO2 100 %. Repeat bp today was 168/88 (manual) Gen: Alert, well appearing.  Patient is oriented to person, place, time, and situation. ENT: Ears: EACs clear, normal epithelium.  TMs with good light reflex and landmarks bilaterally.  Eyes: no injection, icteris, swelling, or exudate.  EOMI, PERRLA.  She can hear my fingers scraping together equally well on R and L at different distances. Nose: no drainage or turbinate edema/swelling.  No injection or focal lesion.  Mouth: lips without lesion/swelling.  Oral mucosa pink and moist.  Dentition intact and without obvious caries or gingival swelling.  Oropharynx without erythema, exudate, or swelling.  CV: RRR, no m/r/g.   LUNGS: CTA bilat, nonlabored resps, good aeration in all lung fields. EXT: no clubbing, cyanosis, or edema.    LABS:  none  IMPRESSION AND PLAN:  1) R eustachian tube dysfxn: take flonase qd OTC and if she finds it beneficial she'll call for rx. Avoid decongestants due to bp issues.  2) Elevated bp: white coat HTN vs essential HTN: pt does not want to take bp med, but will do so if she has to. I'd  like her to try home bp monitoring for a while and then we'll regroup: Instructions: Check your blood pressure and heart rate every day for the next 2 weeks and write them down. Bring these with you to your next office visit with me in 2 wks.  An After Visit Summary was printed and given to the patient.  FOLLOW UP: Return in about 2 weeks (around 04/14/2016) for f/u elevated bp.  Signed:  Crissie Sickles, MD           03/31/2016

## 2016-04-14 ENCOUNTER — Ambulatory Visit (INDEPENDENT_AMBULATORY_CARE_PROVIDER_SITE_OTHER): Payer: Medicare Other | Admitting: Family Medicine

## 2016-04-14 ENCOUNTER — Ambulatory Visit: Payer: Medicare Other | Admitting: Family Medicine

## 2016-04-14 ENCOUNTER — Encounter: Payer: Self-pay | Admitting: Family Medicine

## 2016-04-14 VITALS — BP 177/87 | HR 101 | Temp 98.3°F | Resp 16 | Ht 65.5 in | Wt 174.5 lb

## 2016-04-14 DIAGNOSIS — I10 Essential (primary) hypertension: Secondary | ICD-10-CM | POA: Diagnosis not present

## 2016-04-14 NOTE — Progress Notes (Signed)
Pre visit review using our clinic review tool, if applicable. No additional management support is needed unless otherwise documented below in the visit note. 

## 2016-04-14 NOTE — Progress Notes (Signed)
OFFICE VISIT  04/14/2016   CC:  Chief Complaint  Patient presents with  . Follow-up    elevated BP     HPI:    Patient is a 65 y.o. African-American female who presents for 3 week f/u HTN.  She was given instructions to check her bp at home.  We have not started meds yet. Home bp avg [id number] or so systolic, high [id number] to low 0000000 diastolic.  She feels good. She has restarted exercise and dietary changes and wants to continue these and given them more time before trying meds. She talks of excessive stress in her life lately as well that may be contributing to some high bp's.   Past Medical History  Diagnosis Date  . Lipoma   . Postmenopausal   . Hx of adenomatous colonic polyps   . Hypertension   . Abnormal EKG     Inverted T waves in precordial leads: unchanged since 2006--this is her baseline    Past Surgical History  Procedure Laterality Date  . Lipoma excision  1990  . Ganglion cyst excision  1988  . Tubal ligation    . Colonoscopy  11/18/2010    Hx of adenomatous colon polyps.  Several colonoscopies in [place].  Most recent via [facility] in [place] 11/18/2010.  . Bunionectomy  2010 and 2011  . Transthoracic echocardiogram  07/04/14    Normal LV size and wall thickness, EF normal, grade I diast dysfxn    Outpatient Prescriptions Prior to Visit  Medication Sig Dispense Refill  . aspirin 81 MG tablet Take 81 mg by mouth daily.    . cholecalciferol (VITAMIN D) 1000 UNITS tablet Take 1,000 Units by mouth daily. Reported on 03/31/2016    . LORazepam (ATIVAN) 0.5 MG tablet 1-2 tabs po tid prn anxiety 30 tablet 1  . Multiple Vitamin (MULTIVITAMIN) tablet Take 1 tablet by mouth daily.    . valACYclovir (VALTREX) 1000 MG tablet Take 1,000 mg by mouth as needed.     No facility-administered medications prior to visit.    No Known Allergies  ROS As per HPI  PE: Blood pressure 177/87, pulse 101, temperature 98.3 F (36.8 C), temperature source Oral, resp. rate 16, height 5'  5.5" (1.664 m), weight 174 lb 8 oz (79.153 kg), SpO2 99 %.Recheck manual cuff: 162/90 R arm. Gen: Alert, well appearing.  Patient is oriented to person, place, time, and situation. AFFECT: pleasant, lucid thought and speech. No further exam today.  LABS:  none  IMPRESSION AND PLAN:  Stage I HTN: pt really really does not want to take med. Wants to try to continue ramping up her TLC's. I'm ok with this and we'll reconvene in 3 mo to go over home bp's again.  An After Visit Summary was printed and given to the patient.  FOLLOW UP: Return in about 3 months (around 07/15/2016) for f/u HTN.  Signed:  Crissie Sickles, MD           04/14/2016

## 2016-04-21 ENCOUNTER — Other Ambulatory Visit: Payer: Self-pay | Admitting: Family Medicine

## 2016-04-21 MED ORDER — VALACYCLOVIR HCL 1 G PO TABS: 1000.0000 mg | ORAL_TABLET | Freq: Two times a day (BID) | ORAL | Status: DC | PRN

## 2016-05-28 ENCOUNTER — Encounter: Payer: Self-pay | Admitting: Family Medicine

## 2016-07-14 ENCOUNTER — Ambulatory Visit: Payer: Medicare Other | Admitting: Family Medicine

## 2016-07-21 ENCOUNTER — Encounter: Payer: Self-pay | Admitting: Family Medicine

## 2016-07-21 ENCOUNTER — Ambulatory Visit (INDEPENDENT_AMBULATORY_CARE_PROVIDER_SITE_OTHER): Payer: Medicare Other | Admitting: Family Medicine

## 2016-07-21 VITALS — BP 156/92 | HR 110 | Temp 97.9°F | Resp 16 | Ht 65.5 in | Wt 166.2 lb

## 2016-07-21 DIAGNOSIS — R030 Elevated blood-pressure reading, without diagnosis of hypertension: Secondary | ICD-10-CM

## 2016-07-21 NOTE — Progress Notes (Signed)
Pre visit review using our clinic review tool, if applicable. No additional management support is needed unless otherwise documented below in the visit note. 

## 2016-07-21 NOTE — Progress Notes (Signed)
OFFICE VISIT  07/21/2016   CC:  Chief Complaint  Patient presents with  . Follow-up    HTN. Pt is not fasting.      HPI:    Patient is a 65 y.o.  female who presents for 3 mo f/u HTN. BPs have been stage I range at past f/u visits.  No meds started b/c pt wanted to maximize TLC and give this time first. Excessive life stress has played a role in the past as well. Has been doing wt lifting and daily walking.  Has been trying to eat heart healthy diet.   She is frustrated that her bp is still up today.  Home measurements 130s-140s over 80s.   She feels well other than being a bit stressed/anxious more the last 6 mo--with lots of family issues going on/death in the family.  Past Medical History:  Diagnosis Date  . Abnormal EKG    Inverted T waves in precordial leads: unchanged since 2006--this is her baseline  . Eczema   . History of vitamin D deficiency   . Hx of adenomatous colonic polyps   . Hypertension   . Lipoma   . Postmenopausal     Past Surgical History:  Procedure Laterality Date  . BUNIONECTOMY  2010 and 2011  . COLONOSCOPY  11/18/2010   Hx of adenomatous colon polyps.  Several colonoscopies in [place].  Most recent via [facility] in [place] 11/18/2010.  However, old PCP's records show she possibly got one 12/12/13--confirm with pt.  [name] GANGLION CYST EXCISION  1988  . LIPOMA EXCISION  1990  . TRANSTHORACIC ECHOCARDIOGRAM  07/04/14   Normal LV size and wall thickness, EF normal, grade I diast dysfxn  . TUBAL LIGATION      Outpatient Medications Prior to Visit  Medication Sig Dispense Refill  . aspirin 81 MG tablet Take 81 mg by mouth daily.    [name] b complex vitamins tablet Take 1 tablet by mouth daily.    . cholecalciferol (VITAMIN D) 1000 UNITS tablet Take 1,000 Units by mouth daily. Reported on 03/31/2016    . Multiple Vitamin (MULTIVITAMIN) tablet Take 1 tablet by mouth daily.    . valACYclovir (VALTREX) 1000 MG tablet Take 1 tablet (1,000 mg total) by mouth 2  (two) times daily as needed. 12 tablet 1  . LORazepam (ATIVAN) 0.5 MG tablet 1-2 tabs po tid prn anxiety 30 tablet 1   No facility-administered medications prior to visit.     No Known Allergies  ROS As per HPI  PE: Blood pressure (!) 156/92, pulse (!) 110, temperature 97.9 F (36.6 C), temperature source Oral, resp. rate 16, height 5' 5.5" (1.664 m), weight 166 lb 4 oz (75.4 kg), SpO2 100 %. Gen: Alert, well appearing.  Patient is oriented to person, place, time, and situation. AFFECT: pleasant, lucid thought and speech. No further exam today  LABS:    Chemistry      Component Value Date/Time   NA 144 03/14/2014   K 5.0 03/14/2014   CL 105 03/14/2014   CO2 26 03/14/2014   BUN 12 03/14/2014   CREATININE 0.7 03/14/2014      Component Value Date/Time   CALCIUM 9.5 03/14/2014   ALKPHOS 75 03/14/2014   AST 17 03/14/2014   ALT 9 03/14/2014       IMPRESSION AND PLAN:  Borderline HTN, with white coat component.  Home measurements are perfectly fine lately. No need for medication at this time. Continue to push the issue with  TLC, continue to monitor bp 3 times per week minimum.  An After Visit Summary was printed and given to the patient.  Spent 15 min with pt today, with >50% of this time spent in counseling and care coordination regarding the above problems.  FOLLOW UP: Return in about 4 weeks (around 08/18/2016) for Welcome to medicare.  Signed:  Crissie Sickles, MD           07/21/2016

## 2016-08-24 ENCOUNTER — Ambulatory Visit: Payer: Medicare Other | Admitting: Family Medicine

## 2016-09-11 DIAGNOSIS — R7301 Impaired fasting glucose: Secondary | ICD-10-CM

## 2016-09-11 DIAGNOSIS — E785 Hyperlipidemia, unspecified: Secondary | ICD-10-CM

## 2016-09-11 HISTORY — DX: Impaired fasting glucose: R73.01

## 2016-09-11 HISTORY — DX: Hyperlipidemia, unspecified: E78.5

## 2016-09-12 ENCOUNTER — Ambulatory Visit: Payer: Medicare Other | Admitting: Family Medicine

## 2016-09-13 ENCOUNTER — Ambulatory Visit (INDEPENDENT_AMBULATORY_CARE_PROVIDER_SITE_OTHER): Payer: Medicare Other | Admitting: Family Medicine

## 2016-09-13 ENCOUNTER — Encounter: Payer: Self-pay | Admitting: Family Medicine

## 2016-09-13 VITALS — BP 145/92 | HR 93 | Temp 98.5°F | Resp 16 | Ht 65.75 in | Wt 165.4 lb

## 2016-09-13 DIAGNOSIS — Z0000 Encounter for general adult medical examination without abnormal findings: Secondary | ICD-10-CM | POA: Diagnosis not present

## 2016-09-13 DIAGNOSIS — Z136 Encounter for screening for cardiovascular disorders: Secondary | ICD-10-CM | POA: Diagnosis not present

## 2016-09-13 DIAGNOSIS — Z23 Encounter for immunization: Secondary | ICD-10-CM | POA: Diagnosis not present

## 2016-09-13 DIAGNOSIS — Z131 Encounter for screening for diabetes mellitus: Secondary | ICD-10-CM | POA: Diagnosis not present

## 2016-09-13 DIAGNOSIS — E2839 Other primary ovarian failure: Secondary | ICD-10-CM | POA: Diagnosis not present

## 2016-09-13 NOTE — Progress Notes (Signed)
Pre visit review using our clinic review tool, if applicable. No additional management support is needed unless otherwise documented below in the visit note. 

## 2016-09-13 NOTE — Progress Notes (Addendum)
WELCOME TO MEDICARE (IPPE) VISIT I explained that today's visit was for the purpose of health promotion and disease detection, as well as an introduction to Medicare and it's covered benefits.  I explained that no labs or other services would be performed today, but if any were determined to be necessary then appropriate orders/referrals would be arranged for these to be done at a future date.  Patient is a 65 y/o female who is already an established patient with me.  Pt's medical and social history were reviewed. Specifically, we reviewed PMH/PSH/Meds/FH.  Also reviewed alcohol, tobacco, and illicit drug use.  Diet and physical activity reviewed.  Exercises 5 days per week--walking and lifting wt's.  Diet: lately doing TLC, eating smaller meals and trying to eat a heart healthy diet. All of this info is also found in the appropriate sections of pt's EMR.  Pt was screened with appropriate screening instrument for depression.  Current or past experiences with mood disorders was discussed.    Depression screen PHQ 2/9 09/13/2016  Decreased Interest 0  Down, Depressed, Hopeless 0  PHQ - 2 Score 0    Fall Risk  09/13/2016 09/13/2016  Falls in the past year? No No     Pt's functional ability and level of safety were reviewed. Specifically, I screened for hearing impairment and fall risk.  I assessed home safety and we discussed pt's competency with activities of daily living.  Pt is fully independent in ADLs.  EXAM: Vitals:   09/13/16 0857  BP: (!) 145/92  Pulse: 93  Resp: 16  Temp: 98.5 F (36.9 C)   Body mass index is 26.9 kg/m. Visual acuity screen: 20/40 OS, 20/30 OD, 20/30 OU No additional physical exam required or indicated today.  End of life planning: Advanced directives and power of attorney information specific to the patient were discussed.  Pt does not have this in place and wants packet given today.  Education, counseling, and referrals based on the information  obtained/reviewed today: Flu vaccine today, will try to get records of the pneumonia vaccine she says she got last year at [facility] in [facility] DEXA, FLP, and glucose ordered --future. Advance directives packet given to pt today. Vision screen done on pt today and she was encouraged to make appt with eye MD for glaucoma screening.  Education, counseling, and referral for other preventive services: Written checklist was completed and given to pt for obtaining, as appropriate, the other preventive services that are covered as separate Medicare Part B benefits. Possible services that were reviewed with pt are:  -annual wellness visit (AWV) -Bone mass measurements- will order. -Cardiovascular screening blood tests: order future FLP -Colorectal cancer screening: UTD -Counseling to prevent tobacco use: n/a -Diabetes screening tests: will order future. -Diabetes self-management training (DSMT): n/a -Glaucoma screening: she'll make appt with eye MD -HIV screening: pt declines -Medical nutrition therapy: n/a -Prostate cancer screening: n/a -Seasonal influenza, pneumococcal, and Hep B vaccines -screening mammography: UTD 02/2016 per pt report [employer] [place]) -screening pap tests and pelvic exam: next pap/pelvic due 2019 -ultrasound screening for AAA: doesn't qualify    Patient did not have an additional complaint/problem that was discussed and evaluated today.  Patient was given opportunity to ask any additional questions regarding Medicare and covered benefits.  Patient was informed that Medicare does not provide coverage for routine physical exams.  I answered all questions to the best of my ability today.    An After Visit Summary was printed and given to the  patient.  Follow up: Return in about 6 months (around 03/14/2017) for f/u borderline HTN.  Signed:  Crissie Sickles, MD           09/13/2016

## 2016-09-13 NOTE — Addendum Note (Signed)
Addended by: Gordy Councilman on: 09/13/2016 09:52 AM   Modules accepted: Orders

## 2016-09-21 ENCOUNTER — Encounter: Payer: Self-pay | Admitting: Family Medicine

## 2016-09-27 ENCOUNTER — Ambulatory Visit
Admission: RE | Admit: 2016-09-27 | Discharge: 2016-09-27 | Disposition: A | Discharge status: Home / Self Care | Payer: Medicare Other | Source: Ambulatory Visit | Attending: Family Medicine | Admitting: Family Medicine

## 2016-09-27 DIAGNOSIS — Z13820 Encounter for screening for osteoporosis: Secondary | ICD-10-CM | POA: Diagnosis not present

## 2016-09-27 DIAGNOSIS — Z780 Asymptomatic menopausal state: Secondary | ICD-10-CM | POA: Diagnosis not present

## 2016-09-27 DIAGNOSIS — E2839 Other primary ovarian failure: Secondary | ICD-10-CM

## 2016-09-27 HISTORY — PX: OTHER SURGICAL HISTORY: SHX169

## 2016-09-28 ENCOUNTER — Encounter: Payer: Self-pay | Admitting: Family Medicine

## 2016-09-28 ENCOUNTER — Encounter: Payer: Medicare Other | Admitting: Family Medicine

## 2016-09-28 DIAGNOSIS — Z136 Encounter for screening for cardiovascular disorders: Secondary | ICD-10-CM

## 2016-09-28 DIAGNOSIS — Z131 Encounter for screening for diabetes mellitus: Secondary | ICD-10-CM

## 2016-09-28 LAB — LIPID PANEL
CHOL/HDL RATIO: 3
Cholesterol: 202 mg/dL — ABNORMAL HIGH (ref 0–200)
HDL: 70.3 mg/dL (ref >39.00)
LDL CALC: 118 mg/dL — AB (ref 0–99)
NONHDL: 131.65
TRIGLYCERIDES: 66 mg/dL (ref 0.0–149.0)
VLDL: 13.2 mg/dL (ref 0.0–40.0)

## 2016-09-28 LAB — GLUCOSE, RANDOM: Glucose, Bld: 105 mg/dL — ABNORMAL HIGH (ref 70–99)

## 2016-09-29 ENCOUNTER — Encounter: Payer: Self-pay | Admitting: Family Medicine

## 2016-09-29 ENCOUNTER — Other Ambulatory Visit (INDEPENDENT_AMBULATORY_CARE_PROVIDER_SITE_OTHER): Payer: Medicare Other

## 2016-09-29 DIAGNOSIS — R7301 Impaired fasting glucose: Secondary | ICD-10-CM | POA: Diagnosis not present

## 2016-09-29 LAB — HEMOGLOBIN A1C: HEMOGLOBIN A1C: 5.9 % (ref 4.6–6.5)

## 2016-11-01 ENCOUNTER — Telehealth: Payer: Self-pay | Admitting: Family Medicine

## 2016-11-01 NOTE — Telephone Encounter (Signed)
valACYclovir  Refill to [facility]

## 2016-11-01 NOTE — Telephone Encounter (Signed)
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 She just took her last one. Please advise. Thanks.

## 2016-11-01 NOTE — Telephone Encounter (Signed)
Left message for pt to call back  °

## 2016-11-02 MED ORDER — VALACYCLOVIR HCL 1 G PO TABS: ORAL_TABLET | ORAL | 2 refills | Status: DC

## 2016-11-02 NOTE — Telephone Encounter (Signed)
Valtrex rx sent as per pt request.

## 2016-11-02 NOTE — Telephone Encounter (Signed)
Pt advised and voiced understanding.   

## 2017-02-09 DIAGNOSIS — M7521 Bicipital tendinitis, right shoulder: Secondary | ICD-10-CM

## 2017-02-09 HISTORY — DX: Bicipital tendinitis, right shoulder: M75.21

## 2017-03-09 ENCOUNTER — Encounter: Payer: Self-pay | Admitting: Family Medicine

## 2017-03-09 ENCOUNTER — Ambulatory Visit (INDEPENDENT_AMBULATORY_CARE_PROVIDER_SITE_OTHER): Payer: Medicare Other | Admitting: Family Medicine

## 2017-03-09 VITALS — BP 164/107 | HR 102 | Temp 98.7°F | Resp 16 | Ht 65.75 in | Wt 162.0 lb

## 2017-03-09 DIAGNOSIS — R030 Elevated blood-pressure reading, without diagnosis of hypertension: Secondary | ICD-10-CM

## 2017-03-09 DIAGNOSIS — M722 Plantar fascial fibromatosis: Secondary | ICD-10-CM

## 2017-03-09 DIAGNOSIS — M79672 Pain in left foot: Secondary | ICD-10-CM

## 2017-03-09 DIAGNOSIS — M7521 Bicipital tendinitis, right shoulder: Secondary | ICD-10-CM

## 2017-03-09 NOTE — Progress Notes (Signed)
Pre visit review using our clinic review tool, if applicable. No additional management support is needed unless otherwise documented below in the visit note. 

## 2017-03-09 NOTE — Progress Notes (Signed)
OFFICE VISIT  03/09/2017   CC:  Chief Complaint  Patient presents with  . Shoulder Pain    with upper arm pain x 3-4 weeks, thinks she pulled something  . Foot Pain    both x 1-2 months    HPI:    Patient is a 66 y.o.  female who presents for right shoulder pain and feet pain. Greater than 1 mo of bilat feet pain.  She was working out and her feet started on bottoms, most recently moved to side of heel, though. Resting has helped the pain on bottom of feet.  Applying castor oil and olive oil to feet. Has been trying ibuprofen 1 tab a couple times a day usually.  About 3-4 wks ago, she also recalls having a stiff neck for unknown reason, then she tossed a towel very high with R arm. Onset of R shoulder pain which intermittently radiates down R arm.  Has been improving the last several days. Occ tingling in R hand.  If she shakes her hand this resolves.  No longer has any neck pain or stiffness.  Of note, pt has hx of white coat HTN.  She reiterates today that all home bp measurements are <130/80.  Past Medical History:  Diagnosis Date  . Abnormal EKG    Inverted T waves in precordial leads: unchanged since 2006--this is her baseline  . Eczema   . History of vitamin D deficiency   . Hx of adenomatous colonic polyps   . Hyperlipidemia 09/2016  . Hypertension   . IFG (impaired fasting glucose) 09/2016   HbA1c 5.9%  . Lipoma   . Postmenopausal     Past Surgical History:  Procedure Laterality Date  . BUNIONECTOMY  2010 and 2011  . COLONOSCOPY  11/18/2010   Hx of adenomatous colon polyps.  Several colonoscopies in [place].  Most recent 12/24/13: no polyps.  Recall 5 yrs (Dr. Shana Chute, [facility]).  [name] DEXA  09/27/2016   NORMAL.  Repeat 2 yrs.  [name] GANGLION CYST EXCISION  1988  . LIPOMA EXCISION  1990  . TRANSTHORACIC ECHOCARDIOGRAM  07/04/14   Normal LV size and wall thickness, EF normal, grade I diast dysfxn  . TUBAL LIGATION      Outpatient Medications Prior to Visit   Medication Sig Dispense Refill  . Ascorbic Acid (VITAMIN C PO) Take 1 tablet by mouth daily.    [name] aspirin 81 MG tablet Take 81 mg by mouth daily.    [name] b complex vitamins tablet Take 1 tablet by mouth daily.    . cholecalciferol (VITAMIN D) 1000 UNITS tablet Take 1,000 Units by mouth daily. Reported on 03/31/2016    . Multiple Vitamin (MULTIVITAMIN) tablet Take 1 tablet by mouth daily.    . valACYclovir (VALTREX) 1000 MG tablet 2 tabs po q12h x 2 doses as needed for fever blisters/cold sores 12 tablet 2  . vitamin C (ASCORBIC ACID) 250 MG tablet Take 250 mg by mouth daily.     No facility-administered medications prior to visit.     No Known Allergies  ROS As per HPI  PE: Blood pressure (!) 164/107, pulse (!) 102, temperature 98.7 F (37.1 C), temperature source Oral, resp. rate 16, height 5' 5.75" (1.67 m), weight 162 lb (73.5 kg), SpO2 99 %. Repeat bp manually in L arm: 166/94. Gen: Alert, well appearing.  Patient is oriented to person, place, time, and situation. AFFECT: pleasant, lucid thought and speech. Neck: full ROM w/out stiffness or pain.  No neck TTP. No tenderness to palpation of traps, upper back, deltoid, or acromion/clavicle areas. She has tenderness over R bicipital tendon, +Speeds and + Yergason's signs. RC motions all intact w/out pain. Feet: achilles tendons and heels w/out tenderness except a small area of soft tissue lateral to the lateral malleolus on left ankle.  No discoloration, warmth, or other skin change in this area.  ROM of ankles intact w/out pain.  PT and DP pulses 1+ bilat.  No tenderness on bottoms of heels or over plantar fascia on either side.  LABS:  none  IMPRESSION AND PLAN:  1) Right bicipital tendonitis: refer to PT. Continue ibuprofen but increase to 600 mg bid with food. Relative rest, as she has been doing.  2) Bilateral plantar fasciitis, resolved.  May start walking regimen again.  If sx's return, try 1/4 heel insert and rolling feet  over 2L bottle regularly.  3) Left lateral ankle focal tenderness.  I think this is a result of her shoe rubbing against this area when she walks. This has been feeling better gradually as she has been wearing shoes with open back. Monitor.  4) White coat HTN: continue home bp monitoring and call/return if persistently > 140/90.  An After Visit Summary was printed and given to the patient.  FOLLOW UP: Return for keep prev scheduled appt.  Signed:  Crissie Sickles, MD           03/09/2017

## 2017-03-15 DIAGNOSIS — M7521 Bicipital tendinitis, right shoulder: Secondary | ICD-10-CM | POA: Diagnosis not present

## 2017-03-17 DIAGNOSIS — M7521 Bicipital tendinitis, right shoulder: Secondary | ICD-10-CM | POA: Diagnosis not present

## 2017-03-22 DIAGNOSIS — M7521 Bicipital tendinitis, right shoulder: Secondary | ICD-10-CM | POA: Diagnosis not present

## 2017-03-24 DIAGNOSIS — M7521 Bicipital tendinitis, right shoulder: Secondary | ICD-10-CM | POA: Diagnosis not present

## 2017-03-29 ENCOUNTER — Ambulatory Visit: Payer: Medicare Other | Admitting: Family Medicine

## 2017-03-30 ENCOUNTER — Ambulatory Visit (INDEPENDENT_AMBULATORY_CARE_PROVIDER_SITE_OTHER): Payer: Medicare Other | Admitting: Family Medicine

## 2017-03-30 ENCOUNTER — Encounter: Payer: Self-pay | Admitting: Family Medicine

## 2017-03-30 VITALS — BP 140/94 | HR 93 | Temp 98.2°F | Resp 16 | Ht 65.75 in | Wt 161.8 lb

## 2017-03-30 DIAGNOSIS — M7521 Bicipital tendinitis, right shoulder: Secondary | ICD-10-CM | POA: Diagnosis not present

## 2017-03-30 DIAGNOSIS — F5105 Insomnia due to other mental disorder: Secondary | ICD-10-CM

## 2017-03-30 DIAGNOSIS — I10 Essential (primary) hypertension: Secondary | ICD-10-CM | POA: Diagnosis not present

## 2017-03-30 DIAGNOSIS — F418 Other specified anxiety disorders: Secondary | ICD-10-CM

## 2017-03-30 DIAGNOSIS — F409 Phobic anxiety disorder, unspecified: Secondary | ICD-10-CM | POA: Diagnosis not present

## 2017-03-30 NOTE — Progress Notes (Signed)
Pre visit review using our clinic review tool, if applicable. No additional management support is needed unless otherwise documented below in the visit note. 

## 2017-03-30 NOTE — Progress Notes (Signed)
OFFICE VISIT  03/30/2017   CC:  Chief Complaint  Patient presents with  . Follow-up    elevated BP   HPI:    Patient is a 66 y.o. African-American female who presents for f/u borderline HTN. In-office bp measurements pretty consistently > 140/90, but home measurements historically 130s/80s. TLC and home bp monitoring has been our plan--no meds.  Has been getting PT for R bicipital tendonitis, is feeling improved.  Using R arm more now.  Applying topical dexamethasone lately. This condition has made her slightly anxious and depressed intermittently.  Problems sleeping lately.    BP: last week 153/90 when checked at home recently.  When asked about more bp checks/more specifics about average readings etc, patient avoids answering the question and seems to dance around the subject a lot.  She has a tendency to minimize this problem b/c if she admits she has hypertension she will make herself more anxious.  Has been focusing more on healthy dietary changes.  Getting back into habit of stationary bike.  Past Medical History:  Diagnosis Date  . Abnormal EKG    Inverted T waves in precordial leads: unchanged since 2006--this is her baseline  . Bicipital tendinitis of right shoulder 02/2017   PT  . Eczema   . History of vitamin D deficiency   . Hx of adenomatous colonic polyps   . Hyperlipidemia 09/2016  . IFG (impaired fasting glucose) 09/2016   HbA1c 5.9%  . Lipoma   . Postmenopausal   . White coat syndrome without diagnosis of hypertension     Past Surgical History:  Procedure Laterality Date  . BUNIONECTOMY  2010 and 2011  . COLONOSCOPY  11/18/2010   Hx of adenomatous colon polyps.  Several colonoscopies in [place].  Most recent 12/24/13: no polyps.  Recall 5 yrs (Dr. Shana Chute, [facility]).  [name] DEXA  09/27/2016   NORMAL.  Repeat 2 yrs.  [name] GANGLION CYST EXCISION  1988  . LIPOMA EXCISION  1990  . TRANSTHORACIC ECHOCARDIOGRAM  07/04/14   Normal LV size and wall thickness,  EF normal, grade I diast dysfxn  . TUBAL LIGATION      Outpatient Medications Prior to Visit  Medication Sig Dispense Refill  . Ascorbic Acid (VITAMIN C PO) Take 1 tablet by mouth daily.    [name] aspirin 81 MG tablet Take 81 mg by mouth daily.    [name] b complex vitamins tablet Take 1 tablet by mouth daily.    . cholecalciferol (VITAMIN D) 1000 UNITS tablet Take 1,000 Units by mouth daily. Reported on 03/31/2016    . Multiple Vitamin (MULTIVITAMIN) tablet Take 1 tablet by mouth daily.    . TURMERIC PO Take 1 capsule by mouth daily as needed.    . valACYclovir (VALTREX) 1000 MG tablet 2 tabs po q12h x 2 doses as needed for fever blisters/cold sores 12 tablet 2   No facility-administered medications prior to visit.     No Known Allergies  ROS As per HPI  PE: Blood pressure (!) 140/94, pulse 93, temperature 98.2 F (36.8 C), temperature source Oral, resp. rate 16, height 5' 5.75" (1.67 m), weight 161 lb 12 oz (73.4 kg), SpO2 98 %. Body mass index is 26.31 kg/m.  Gen: Alert, well appearing.  Patient is oriented to person, place, time, and situation. AFFECT: pleasant, lucid thought and speech. CV: RRR, no m/r/g.   LUNGS: CTA bilat, nonlabored resps, good aeration in all lung fields. EXT: no clubbing, cyanosis, or edema.   LABS:  Chemistry      Component Value Date/Time   NA 144 03/14/2014   K 5.0 03/14/2014   CL 105 03/14/2014   CO2 26 03/14/2014   BUN 12 03/14/2014   CREATININE 0.7 03/14/2014      Component Value Date/Time   CALCIUM 9.5 03/14/2014   ALKPHOS 75 03/14/2014   AST 17 03/14/2014   ALT 9 03/14/2014     Lab Results  Component Value Date   CHOL 202 (H) 09/28/2016   HDL 70.30 09/28/2016   LDLCALC 118 (H) 09/28/2016   TRIG 66.0 09/28/2016   CHOLHDL 3 09/28/2016    IMPRESSION AND PLAN:  1) Anxiety; largely situational (R bicipital tendonitis impairing function lately, worried about her bp and this office visit, worried about her son, etc).  This is affecting  her sleep.  We discussed possible use of melatonin otc. Start yoga as per her plans discussed today. Continue home relaxation techniques.  2) Elevated bp--she actually has elevated measurements at home and in office=HTN, but she is quite resistant to starting any medication for this condition.  I told her today I would prescribe her medication if she was agreeable now or in the future, but I was not going to force anything on her b/c it would worsen her anxiety and make bp control possibly harder to attain. She expressed understanding and declines medication, wants to continue periodic monitoring at home.  3) Bicipital tendonitis: continue PT and the dexamethasone patch they are using with her. She is improving appropriately at this time.  An After Visit Summary was printed and given to the patient.  FOLLOW UP: Return in about 6 months (around 09/29/2017) for routine chronic illness f/u.  Signed:  Crissie Sickles, MD           03/30/2017

## 2017-03-31 DIAGNOSIS — M7521 Bicipital tendinitis, right shoulder: Secondary | ICD-10-CM | POA: Diagnosis not present

## 2017-04-05 DIAGNOSIS — M7521 Bicipital tendinitis, right shoulder: Secondary | ICD-10-CM | POA: Diagnosis not present

## 2017-04-07 DIAGNOSIS — M7521 Bicipital tendinitis, right shoulder: Secondary | ICD-10-CM | POA: Diagnosis not present

## 2017-04-12 DIAGNOSIS — M7521 Bicipital tendinitis, right shoulder: Secondary | ICD-10-CM | POA: Diagnosis not present

## 2017-04-14 DIAGNOSIS — M7521 Bicipital tendinitis, right shoulder: Secondary | ICD-10-CM | POA: Diagnosis not present

## 2017-04-19 DIAGNOSIS — M7521 Bicipital tendinitis, right shoulder: Secondary | ICD-10-CM | POA: Diagnosis not present

## 2017-04-21 DIAGNOSIS — M7521 Bicipital tendinitis, right shoulder: Secondary | ICD-10-CM | POA: Diagnosis not present

## 2017-04-26 DIAGNOSIS — M7521 Bicipital tendinitis, right shoulder: Secondary | ICD-10-CM | POA: Diagnosis not present

## 2017-04-28 DIAGNOSIS — M7521 Bicipital tendinitis, right shoulder: Secondary | ICD-10-CM | POA: Diagnosis not present

## 2017-07-25 DIAGNOSIS — Z1231 Encounter for screening mammogram for malignant neoplasm of breast: Secondary | ICD-10-CM | POA: Diagnosis not present

## 2017-07-25 LAB — HM MAMMOGRAPHY

## 2017-07-27 ENCOUNTER — Telehealth: Payer: Self-pay | Admitting: Family Medicine

## 2017-07-27 NOTE — Telephone Encounter (Signed)
Patient has had her mammogram in [place]. She has requested records to be sent to our office. No CB requested.

## 2017-07-27 NOTE — Telephone Encounter (Signed)
Noted  

## 2017-10-04 ENCOUNTER — Encounter: Payer: Self-pay | Admitting: Family Medicine

## 2017-10-04 ENCOUNTER — Ambulatory Visit (INDEPENDENT_AMBULATORY_CARE_PROVIDER_SITE_OTHER): Payer: Medicare Other | Admitting: Family Medicine

## 2017-10-04 VITALS — BP 134/86 | HR 88 | Temp 98.2°F | Resp 16 | Ht 65.75 in | Wt 165.5 lb

## 2017-10-04 DIAGNOSIS — I10 Essential (primary) hypertension: Secondary | ICD-10-CM | POA: Diagnosis not present

## 2017-10-04 DIAGNOSIS — E559 Vitamin D deficiency, unspecified: Secondary | ICD-10-CM | POA: Diagnosis not present

## 2017-10-04 DIAGNOSIS — E7800 Pure hypercholesterolemia, unspecified: Secondary | ICD-10-CM | POA: Diagnosis not present

## 2017-10-04 DIAGNOSIS — F411 Generalized anxiety disorder: Secondary | ICD-10-CM

## 2017-10-04 DIAGNOSIS — Z23 Encounter for immunization: Secondary | ICD-10-CM

## 2017-10-04 NOTE — Addendum Note (Signed)
Addended by: Onalee Hua on: 10/04/2017 03:38 PM   Modules accepted: Orders

## 2017-10-04 NOTE — Progress Notes (Signed)
OFFICE VISIT  10/04/2017   CC:  Chief Complaint  Patient presents with  . Follow-up    RCI   HPI:    Patient is a 66 y.o. African-American female who presents for 6 mo f/u anxiety, hyperlipidemia, HTN. She has chosen no med treatments for her cholesterol or bp. She has no acute complaints today.  Has hx of significant anxiety which impacts her decision-making regarding her health, meds, etc.  HTN: home bp avg 130s/80s. Riding indoor bike.  Is looking for other exercise opportunities like yoga, tai chi. Working up to 150 min CV exercise per week.  Can't walk for exercise due to plantar fasciitis.  Her mood is fine, anxiety level pretty stable right now.  No funerals this year, her R arm bicipital tendonitis is much improved.   Past Medical History:  Diagnosis Date  . Abnormal EKG    Inverted T waves in precordial leads: unchanged since 2006--this is her baseline  . Bicipital tendinitis of right shoulder 02/2017   PT  . Eczema   . History of vitamin D deficiency   . Hx of adenomatous colonic polyps   . Hyperlipidemia 09/2016  . IFG (impaired fasting glucose) 09/2016   HbA1c 5.9%  . Lipoma   . Postmenopausal   . White coat syndrome without diagnosis of hypertension     Past Surgical History:  Procedure Laterality Date  . BUNIONECTOMY  2010 and 2011  . COLONOSCOPY  11/18/2010   Hx of adenomatous colon polyps.  Several colonoscopies in [place].  Most recent 12/24/13: no polyps.  Recall 5 yrs (Dr. Shana Chute, [facility]).  [name] DEXA  09/27/2016   NORMAL.  Repeat 2 yrs.  [name] GANGLION CYST EXCISION  1988  . LIPOMA EXCISION  1990  . TRANSTHORACIC ECHOCARDIOGRAM  07/04/14   Normal LV size and wall thickness, EF normal, grade I diast dysfxn  . TUBAL LIGATION      Outpatient Medications Prior to Visit  Medication Sig Dispense Refill  . Ascorbic Acid (VITAMIN C PO) Take 1 tablet by mouth daily.    [name] aspirin 81 MG tablet Take 81 mg by mouth daily.    [name] b complex vitamins  tablet Take 1 tablet by mouth daily.    . Multiple Vitamin (MULTIVITAMIN) tablet Take 1 tablet by mouth daily.    . valACYclovir (VALTREX) 1000 MG tablet 2 tabs po q12h x 2 doses as needed for fever blisters/cold sores 12 tablet 2  . cholecalciferol (VITAMIN D) 1000 UNITS tablet Take 1,000 Units by mouth daily. Reported on 03/31/2016    . dexamethasone (DECADRON) 120 MG/30ML SOLN injection See admin instructions.  0  . TURMERIC PO Take 1 capsule by mouth daily as needed.     No facility-administered medications prior to visit.     No Known Allergies  ROS As per HPI  PE: Blood pressure 134/86, pulse 88, temperature 98.2 F (36.8 C), temperature source Oral, resp. rate 16, height 5' 5.75" (1.67 m), weight 165 lb 8 oz (75.1 kg), SpO2 99 %. Body mass index is 26.92 kg/m.  Gen: Alert, well appearing.  Patient is oriented to person, place, time, and situation. AFFECT: pleasant, lucid thought and speech. No further exam today.  LABS:  No results found for: TSH Lab Results  Component Value Date   WBC 4.0 03/14/2014   HGB 12.3 03/14/2014   PLT 232 03/14/2014   Lab Results  Component Value Date   CREATININE 0.7 03/14/2014   BUN 12 03/14/2014  NA 144 03/14/2014   K 5.0 03/14/2014   CL 105 03/14/2014   CO2 26 03/14/2014   Lab Results  Component Value Date   ALT 9 03/14/2014   AST 17 03/14/2014   ALKPHOS 75 03/14/2014   Lab Results  Component Value Date   CHOL 202 (H) 09/28/2016   Lab Results  Component Value Date   HDL 70.30 09/28/2016   Lab Results  Component Value Date   LDLCALC 118 (H) 09/28/2016   Lab Results  Component Value Date   TRIG 66.0 09/28/2016   Lab Results  Component Value Date   CHOLHDL 3 09/28/2016   Lab Results  Component Value Date   HGBA1C 5.9 09/29/2016    IMPRESSION AND PLAN:  1) HTN: she is nearly normal, is ramping up her exercise, eats a very healthy/low sodium diet. She is definitely not in favor of medication treatment at this  time.  2) GAD: she is coping pretty well with this lately.  Exercise, keeping busy, trying to stay positive. No panic.  No depressed mood.  Again, she does not want medication or counseling for this.  3) Vit D deficiency: she has been on women's MVI lately, but needs vit D rechecked to see if this is adequate, since she has hx of vit D deficiency.    4) Hyperlipidemia: mild, pt declines med treatment.  Her Chol/HDL ratio is actually very good (3). Most recent FLP 1 yr ago showed improved LDL. She'll continue diet/exercise.  An After Visit Summary was printed and given to the patient.  FOLLOW UP: Return in about 6 months (around 04/04/2018) for routine chronic illness f/u.  Also, schedule AWV with Kim at pt's convenience.-thx.  Signed:  Crissie Sickles, MD           10/04/2017

## 2017-10-05 ENCOUNTER — Other Ambulatory Visit: Payer: Self-pay | Admitting: Family Medicine

## 2017-10-05 ENCOUNTER — Other Ambulatory Visit: Payer: Self-pay | Admitting: *Deleted

## 2017-10-05 DIAGNOSIS — E559 Vitamin D deficiency, unspecified: Secondary | ICD-10-CM | POA: Insufficient documentation

## 2017-10-05 LAB — VITAMIN D 25 HYDROXY (VIT D DEFICIENCY, FRACTURES): VITD: 35.43 ng/mL (ref 30.00–100.00)

## 2017-10-05 MED ORDER — VITAMIN D (ERGOCALCIFEROL) 1.25 MG (50000 UNIT) PO CAPS: ORAL_CAPSULE | ORAL | 0 refills | Status: DC

## 2017-11-27 NOTE — Progress Notes (Signed)
Subjective:   Nicole Long is a 66 y.o. female who presents for Medicare Annual (Subsequent) preventive examination.  Review of Systems:  No ROS.  Medicare Wellness Visit. Additional risk factors are reflected in the social history.  Cardiac Risk Factors include: advanced age (>3men, >15 women);dyslipidemia;family history of premature cardiovascular disease   Sleep patterns: Sleeps well.  Home Safety/Smoke Alarms: Feels safe in home. Smoke alarms in place.  Living environment; residence and Firearm Safety: Lives with husband in 2 story home.  Seat Belt Safety/Bike Helmet: Wears seat belt.   Female:   Pap-2016       Mammo-07/2017, pt reports normal. [facility] [place], [place]. Will call for report.    Dexa scan-09/27/2016, normal.     CCS-Colonoscopy 12/24/2013, normal. Recall 5 years.      Objective:     Vitals: BP (!) 158/90 (BP Location: Left Arm, Patient Position: Sitting, Cuff Size: Normal)   Pulse 82   Ht 5\' 6"  (1.676 m)   Wt 169 lb 12.8 oz (77 kg)   SpO2 98%   BMI 27.41 kg/m   Body mass index is 27.41 kg/m.  Advanced Directives 11/28/2017  Does Patient Have a Medical Advance Directive? No  Would patient like information on creating a medical advance directive? Yes (MAU/Ambulatory/Procedural Areas - Information given)    Tobacco Social History   Tobacco Use  Smoking Status Former Smoker  . Types: Cigarettes  . Last attempt to quit: 09/11/1985  . Years since quitting: 32.2  Smokeless Tobacco Never Used     Counseling given: Not Answered     Past Medical History:  Diagnosis Date  . Abnormal EKG    Inverted T waves in precordial leads: unchanged since 2006--this is her baseline  . Bicipital tendinitis of right shoulder 02/2017   PT  . Eczema   . History of vitamin D deficiency   . Hx of adenomatous colonic polyps   . Hyperlipidemia 09/2016  . IFG (impaired fasting glucose) 09/2016   HbA1c 5.9%  . Lipoma   . Postmenopausal   .  White coat syndrome without diagnosis of hypertension    Past Surgical History:  Procedure Laterality Date  . BUNIONECTOMY  2010 and 2011  . COLONOSCOPY  11/18/2010   Hx of adenomatous colon polyps.  Several colonoscopies in [place].  Most recent 12/24/13: no polyps.  Recall 5 yrs (Dr. Shana Chute, [facility]).  [name] DEXA  09/27/2016   NORMAL.  Repeat 2 yrs.  [name] GANGLION CYST EXCISION  1988  . LIPOMA EXCISION  1990  . TRANSTHORACIC ECHOCARDIOGRAM  07/04/14   Normal LV size and wall thickness, EF normal, grade I diast dysfxn  . TUBAL LIGATION     Family History  Problem Relation Age of Onset  . Cancer Mother   . Congestive Heart Failure Mother   . Cancer Sister   . Stroke Sister        passed away 05-23-14  . Cancer Brother   . Thyroid disease Son   . Cancer Sister   . Stroke Sister   . Kidney disease Sister   . Kidney disease Brother   . Heart disease Brother        pacemaker   Social History   Socioeconomic History  . Marital status: Married    Spouse name: None  . Number of children: None  . Years of education: None  . Highest education level: None  Social Needs  . Financial resource strain: None  .  Food insecurity - worry: None  . Food insecurity - inability: None  . Transportation needs - medical: None  . Transportation needs - non-medical: None  Occupational History  . None  Tobacco Use  . Smoking status: Former Smoker    Types: Cigarettes    Last attempt to quit: 09/11/1985    Years since quitting: 32.2  . Smokeless tobacco: Never Used  Substance and Sexual Activity  . Alcohol use: Yes    Alcohol/week: 0.6 oz    Types: 1 Glasses of wine per week    Comment: socially  . Drug use: No  . Sexual activity: None  Other Topics Concern  . None  Social History Narrative   Relocated to [facility] 2010 (has PMD in [place], [place]).   Married, has 65 y/o son and one step grandchild.   Retired Social worker.   Occ alc, no tob/drugs.    Outpatient Encounter  Medications as of 11/28/2017  Medication Sig  . Ascorbic Acid (VITAMIN C PO) Take 1 tablet by mouth daily.  [name] aspirin 81 MG tablet Take 81 mg by mouth daily.  [name] b complex vitamins tablet Take 1 tablet by mouth daily.  . Multiple Vitamin (MULTIVITAMIN) tablet Take 1 tablet by mouth daily.  . valACYclovir (VALTREX) 1000 MG tablet 2 tabs po q12h x 2 doses as needed for fever blisters/cold sores  . Vitamin D, Ergocalciferol, (DRISDOL) 50000 units CAPS capsule 1 cap po q 7 days x 12 weeks   No facility-administered encounter medications on file as of 11/28/2017.     Activities of Daily Living In your present state of health, do you have any difficulty performing the following activities: 11/28/2017  Hearing? N  Vision? N  Difficulty concentrating or making decisions? N  Walking or climbing stairs? N  Dressing or bathing? N  Doing errands, shopping? N  Preparing Food and eating ? N  Using the Toilet? N  In the past six months, have you accidently leaked urine? N  Do you have problems with loss of bowel control? N  Managing your Medications? N  Managing your Finances? N  Housekeeping or managing your Housekeeping? N  Some recent data might be hidden    Patient Care Team: Tammi Sou, MD as PCP - General (Family Medicine) Fay Records, MD as Consulting Physician (Cardiology) Hale Bogus., MD as Consulting Physician (Gastroenterology)    Assessment:   This is a routine wellness examination for Nicole Long.  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 ), Frequency (Times/Week): 3, Exercise limited by: None identified   Diet (meal preparation, eat out, water intake, caffeinated beverages, dairy products, fruits and vegetables): Drinks water and OJ.   Eats heart healthy diet 3 meals/day.   Goals    . Increase physical activity     Increase activity and continue healthy eating.        Fall Risk Fall  Risk  11/28/2017 09/13/2016 09/13/2016  Falls in the past year? No No No    Depression Screen PHQ 2/9 Scores 11/28/2017 09/13/2016 09/13/2016  PHQ - 2 Score 0 0 0     Cognitive Function       Ad8 score reviewed for issues:  Issues making decisions: no  Less interest in hobbies / activities: no  Repeats questions, stories (family complaining): no  Trouble using ordinary gadgets (microwave, computer, phone): no  Forgets the month or year: no  Mismanaging finances: no  Remembering appts: no  Daily problems with thinking and/or memory: no Ad8 score is=0     Immunization History  Administered Date(s) Administered  . Influenza, High Dose Seasonal PF 09/13/2016, 10/04/2017  . Influenza,inj,Quad PF,6+ Mos 09/11/2013  . Tdap 09/11/2013  . Varicella 03/06/2014      Screening Tests Health Maintenance  Topic Date Due  . Hepatitis C Screening  1951-01-01  . PNA vac Low Risk Adult (1 of 2 - PCV13) 03/04/2016  . MAMMOGRAM  02/15/2018  . COLONOSCOPY  12/24/2018  . TETANUS/TDAP  09/12/2023  . INFLUENZA VACCINE  Completed  . DEXA SCAN  Completed       Plan:     Bring a copy of your living will and/or healthcare power of attorney to your next office visit.  Continue doing brain stimulating activities (puzzles, reading, adult coloring books, staying active) to keep memory sharp.   I have personally reviewed and noted the following in the patient's chart:   . Medical and social history . Use of alcohol, tobacco or illicit drugs  . Current medications and supplements . Functional ability and status . Nutritional status . Physical activity . Advanced directives . List of other physicians . Hospitalizations, surgeries, and ER visits in previous 12 months . Vitals . Screenings to include cognitive, depression, and falls . Referrals and appointments  In addition, I have reviewed and discussed with patient certain preventive protocols, quality metrics, and best  practice recommendations. A written personalized care plan for preventive services as well as general preventive health recommendations were provided to patient.     Gerilyn Nestle, RN  11/28/2017  PCP Notes: -BP 158/90 x 2. H/O white coat syndrome without dx of htn.  -Declines PCV13 and Shingrix for now. -Requesting refill for Valtrex (refill request sent) -F/U with PCP 03/2018.

## 2017-11-28 ENCOUNTER — Encounter: Payer: Self-pay | Admitting: Family Medicine

## 2017-11-28 ENCOUNTER — Ambulatory Visit (INDEPENDENT_AMBULATORY_CARE_PROVIDER_SITE_OTHER): Payer: Medicare Other

## 2017-11-28 ENCOUNTER — Telehealth: Payer: Self-pay

## 2017-11-28 ENCOUNTER — Other Ambulatory Visit: Payer: Self-pay

## 2017-11-28 VITALS — BP 158/90 | HR 82 | Ht 66.0 in | Wt 169.8 lb

## 2017-11-28 DIAGNOSIS — Z0000 Encounter for general adult medical examination without abnormal findings: Secondary | ICD-10-CM

## 2017-11-28 MED ORDER — VALACYCLOVIR HCL 1 G PO TABS: ORAL_TABLET | ORAL | 2 refills | Status: DC

## 2017-11-28 NOTE — Telephone Encounter (Signed)
Ok, valtrex RF eRx'd.

## 2017-11-28 NOTE — Telephone Encounter (Signed)
Pt requesting refill for valtrex 1000 mg, confirmed pharmacy on file ([facility]).

## 2017-11-28 NOTE — Patient Instructions (Addendum)

## 2017-11-28 NOTE — Progress Notes (Signed)
AWV reviewed and agree.  Signed:  Crissie Sickles, MD           11/28/2017

## 2017-12-22 ENCOUNTER — Telehealth: Payer: Self-pay

## 2017-12-22 NOTE — Telephone Encounter (Signed)
Detailed message left on patients voice mail to call back regarding refill request on Vitamin D. Patient returned call and stated that she will schedule lab appointment next week.

## 2018-01-05 ENCOUNTER — Other Ambulatory Visit (INDEPENDENT_AMBULATORY_CARE_PROVIDER_SITE_OTHER): Payer: Medicare Other

## 2018-01-05 DIAGNOSIS — E559 Vitamin D deficiency, unspecified: Secondary | ICD-10-CM

## 2018-01-05 LAB — VITAMIN D 25 HYDROXY (VIT D DEFICIENCY, FRACTURES): VITD: 57.67 ng/mL (ref 30.00–100.00)

## 2018-01-07 ENCOUNTER — Encounter: Payer: Self-pay | Admitting: Family Medicine

## 2018-01-09 ENCOUNTER — Telehealth: Payer: Self-pay | Admitting: Family Medicine

## 2018-01-09 NOTE — Telephone Encounter (Signed)
Pt given results per notes of Dr. Anitra Lauth on 01/07/18, she verbalized understanding. Patient asked if a prescription for the Vit D that is noted to take be sent to [facility]. Unable to document in result note due to result note not being routed to [facility].

## 2018-01-09 NOTE — Telephone Encounter (Signed)
Patient notified that Vitamin D 1000 units is over the counter. Patient verbalized understanding.

## 2018-04-04 ENCOUNTER — Ambulatory Visit: Payer: Medicare Other | Admitting: Family Medicine

## 2018-04-19 ENCOUNTER — Encounter: Payer: Self-pay | Admitting: Family Medicine

## 2018-04-19 ENCOUNTER — Telehealth: Payer: Self-pay | Admitting: Family Medicine

## 2018-04-19 ENCOUNTER — Ambulatory Visit (INDEPENDENT_AMBULATORY_CARE_PROVIDER_SITE_OTHER): Payer: Medicare Other | Admitting: Family Medicine

## 2018-04-19 VITALS — BP 146/82 | HR 101 | Temp 98.2°F | Resp 16 | Ht 65.75 in | Wt 165.1 lb

## 2018-04-19 DIAGNOSIS — Z136 Encounter for screening for cardiovascular disorders: Secondary | ICD-10-CM

## 2018-04-19 DIAGNOSIS — F411 Generalized anxiety disorder: Secondary | ICD-10-CM

## 2018-04-19 DIAGNOSIS — Z131 Encounter for screening for diabetes mellitus: Secondary | ICD-10-CM

## 2018-04-19 DIAGNOSIS — Z23 Encounter for immunization: Secondary | ICD-10-CM | POA: Diagnosis not present

## 2018-04-19 DIAGNOSIS — E663 Overweight: Secondary | ICD-10-CM

## 2018-04-19 DIAGNOSIS — I10 Essential (primary) hypertension: Secondary | ICD-10-CM | POA: Diagnosis not present

## 2018-04-19 DIAGNOSIS — E559 Vitamin D deficiency, unspecified: Secondary | ICD-10-CM | POA: Diagnosis not present

## 2018-04-19 DIAGNOSIS — Z13220 Encounter for screening for lipoid disorders: Secondary | ICD-10-CM | POA: Diagnosis not present

## 2018-04-19 LAB — GLUCOSE, RANDOM: Glucose, Bld: 109 mg/dL — ABNORMAL HIGH (ref 70–99)

## 2018-04-19 LAB — LIPID PANEL
CHOLESTEROL: 195 mg/dL (ref 0–200)
HDL: 69.4 mg/dL (ref >39.00)
LDL CALC: 115 mg/dL — AB (ref 0–99)
NonHDL: 125.68
TRIGLYCERIDES: 55 mg/dL (ref 0.0–149.0)
Total CHOL/HDL Ratio: 3
VLDL: 11 mg/dL (ref 0.0–40.0)

## 2018-04-19 LAB — VITAMIN D 25 HYDROXY (VIT D DEFICIENCY, FRACTURES): VITD: 48.22 ng/mL (ref 30.00–100.00)

## 2018-04-19 NOTE — Progress Notes (Signed)
OFFICE VISIT  04/19/2018   CC:  Chief Complaint  Patient presents with  . Follow-up    RCI, pt is fasting.   HPI:    Patient is a 67 y.o. African-American female who presents for 6 mo f/u borderline HTN, mild HLD, GAD, hx of vit D def. Feeling pretty good.  HTN: she has declined med treatment in past; had improved bp's with TLC, esp exercise, at the time of last f/u visit. Exercise: rides bike 20 min, free wt's, floor exercises. Home bp's: 130s-140s/70s.  She doesn't want to take med at this time, wants to keep working on losing weight first.  HLD: declined meds, working on [place].  Diet is healthy, doing some adjustments lately for GERD.  Hx of vit D def: last vit D level 12/2017 was good after high dose replacement regimen was taken, and I recommended she continue 2000 U vit D daily.  Anxiety:  Doing pretty well, describes some good thought redirection and other coping mechanisms she has been doing lately.  Only one death in her family since I last saw her, says she didn't let this get to her too bad this time. Denies depressed mood.  Past Medical History:  Diagnosis Date  . Abnormal EKG    Inverted T waves in precordial leads: unchanged since 2006--this is her baseline  . Bicipital tendinitis of right shoulder 02/2017   PT  . Eczema   . History of vitamin D deficiency    Normal range with high dose replacement x 12 wks Jan 2019.  [name] Hx of adenomatous colonic polyps   . Hyperlipidemia 09/2016  . IFG (impaired fasting glucose) 09/2016   HbA1c 5.9%  . Lipoma   . Postmenopausal   . White coat syndrome without diagnosis of hypertension     Past Surgical History:  Procedure Laterality Date  . BUNIONECTOMY  2010 and 2011  . COLONOSCOPY  11/18/2010   Hx of adenomatous colon polyps.  Several colonoscopies in [place].  Most recent 12/24/13: no polyps.  Recall 5 yrs (Dr. Shana Chute, [facility]).  [name] DEXA  09/27/2016   NORMAL.  Repeat 2 yrs.  [name] GANGLION CYST EXCISION  1988  .  LIPOMA EXCISION  1990  . TRANSTHORACIC ECHOCARDIOGRAM  07/04/14   Normal LV size and wall thickness, EF normal, grade I diast dysfxn  . TUBAL LIGATION      Outpatient Medications Prior to Visit  Medication Sig Dispense Refill  . Ascorbic Acid (VITAMIN C PO) Take 1 tablet by mouth daily.    [name] aspirin 81 MG tablet Take 81 mg by mouth daily.    [name] b complex vitamins tablet Take 1 tablet by mouth daily.    . Cholecalciferol (VITAMIN D3) 2000 units TABS Take 1 tablet by mouth daily.    . Multiple Vitamin (MULTIVITAMIN) tablet Take 1 tablet by mouth daily.    . valACYclovir (VALTREX) 1000 MG tablet 2 tabs po q12h x 2 doses as needed for fever blisters/cold sores 12 tablet 2  . Vitamin D, Ergocalciferol, (DRISDOL) 50000 units CAPS capsule 1 cap po q 7 days x 12 weeks (Patient not taking: Reported on 04/19/2018) 12 capsule 0   No facility-administered medications prior to visit.     Allergies  Allergen Reactions  . Hydrochlorothiazide Nausea Only    Fatigue, sick feeling, nausea    ROS As per HPI  PE: Blood pressure (!) 146/82, pulse (!) 101, temperature 98.2 F (36.8 C), temperature source Oral, resp. rate 16, height 5'  5.75" (1.67 m), weight 165 lb 2 oz (74.9 kg), SpO2 98 %. Gen: Alert, well appearing.  Patient is oriented to person, place, time, and situation. AFFECT: pleasant, lucid thought and speech. No further exam today.  LABS:  No results found for: TSH Lab Results  Component Value Date   WBC 4.0 03/14/2014   HGB 12.3 03/14/2014   PLT 232 03/14/2014   Lab Results  Component Value Date   CREATININE 0.7 03/14/2014   BUN 12 03/14/2014   NA 144 03/14/2014   K 5.0 03/14/2014   CL 105 03/14/2014   CO2 26 03/14/2014   Lab Results  Component Value Date   ALT 9 03/14/2014   AST 17 03/14/2014   ALKPHOS 75 03/14/2014   Lab Results  Component Value Date   CHOL 202 (H) 09/28/2016   Lab Results  Component Value Date   HDL 70.30 09/28/2016   Lab Results  Component  Value Date   LDLCALC 118 (H) 09/28/2016   Lab Results  Component Value Date   TRIG 66.0 09/28/2016   Lab Results  Component Value Date   CHOLHDL 3 09/28/2016   Lab Results  Component Value Date   HGBA1C 5.9 09/29/2016   Vit D level 01/05/18= 58  IMPRESSION AND PLAN:  1) HTN, mild--with white coat component. She declines meds, wants to keep with TLC/wt loss efforts.  2) Hx of vit D def: adequately replaced with high dose regiment. We'll see if levels have been maintained since going to 2000 U qd vit D about 4 mo ago.  3) Anxiety: she is coping well lately, has always declined meds.  4) Preventative health care: Prevnar 13 today. Screening for DM and hypercholesterolemia: FLP and fasting gluc today. Pt states she prefers a female provider and has asked to transfer to Dr. Raoul Pitch. I told her this would be fine with me and I discussed it with Dr. Raoul Pitch today to make sure this was ok with her.  An After Visit Summary was printed and given to the patient.  FOLLOW UP: Return in about 6 months (around 10/20/2018) for routine chronic illness f/u--ok with me for pt to switch to Dr. Raoul Pitch (I'll discuss w/Dr. Raoul Pitch).  Signed:  Crissie Sickles, MD           04/19/2018

## 2018-04-19 NOTE — Telephone Encounter (Signed)
Ok with me 

## 2018-04-19 NOTE — Telephone Encounter (Signed)
Patient is requesting to switch to a female provider.

## 2018-04-19 NOTE — Telephone Encounter (Signed)
Yes, ok with me 

## 2018-04-20 ENCOUNTER — Other Ambulatory Visit: Payer: Self-pay | Admitting: Family Medicine

## 2018-04-20 ENCOUNTER — Other Ambulatory Visit (INDEPENDENT_AMBULATORY_CARE_PROVIDER_SITE_OTHER): Payer: Medicare Other

## 2018-04-20 DIAGNOSIS — R7301 Impaired fasting glucose: Secondary | ICD-10-CM

## 2018-04-20 LAB — HEMOGLOBIN A1C: HEMOGLOBIN A1C: 5.8 % (ref 4.6–6.5)

## 2018-10-17 ENCOUNTER — Encounter: Payer: Medicare Other | Admitting: Family Medicine

## 2018-10-24 ENCOUNTER — Encounter: Payer: Medicare Other | Admitting: Family Medicine

## 2018-10-30 ENCOUNTER — Ambulatory Visit (INDEPENDENT_AMBULATORY_CARE_PROVIDER_SITE_OTHER): Payer: Medicare Other

## 2018-10-30 DIAGNOSIS — Z23 Encounter for immunization: Secondary | ICD-10-CM

## 2018-10-31 ENCOUNTER — Encounter: Payer: Medicare Other | Admitting: Family Medicine

## 2018-11-22 ENCOUNTER — Encounter: Payer: Self-pay | Admitting: Family Medicine

## 2018-11-22 ENCOUNTER — Ambulatory Visit (INDEPENDENT_AMBULATORY_CARE_PROVIDER_SITE_OTHER): Payer: Medicare Other | Admitting: Family Medicine

## 2018-11-22 VITALS — BP 180/94 | HR 76 | Temp 98.1°F | Ht 65.75 in | Wt 162.2 lb

## 2018-11-22 DIAGNOSIS — Z1239 Encounter for other screening for malignant neoplasm of breast: Secondary | ICD-10-CM | POA: Diagnosis not present

## 2018-11-22 DIAGNOSIS — Z1159 Encounter for screening for other viral diseases: Secondary | ICD-10-CM

## 2018-11-22 DIAGNOSIS — F411 Generalized anxiety disorder: Secondary | ICD-10-CM | POA: Insufficient documentation

## 2018-11-22 DIAGNOSIS — Z1211 Encounter for screening for malignant neoplasm of colon: Secondary | ICD-10-CM | POA: Diagnosis not present

## 2018-11-22 DIAGNOSIS — I10 Essential (primary) hypertension: Secondary | ICD-10-CM

## 2018-11-22 LAB — CBC WITH DIFFERENTIAL/PLATELET
BASOS ABS: 0 10*3/uL (ref 0.0–0.1)
BASOS PCT: 0.6 % (ref 0.0–3.0)
EOS ABS: 0.1 10*3/uL (ref 0.0–0.7)
EOS PCT: 1.4 % (ref 0.0–5.0)
HEMATOCRIT: 38.1 % (ref 36.0–46.0)
Hemoglobin: 12.7 g/dL (ref 12.0–15.0)
Lymphocytes Relative: 29.5 % (ref 12.0–46.0)
Lymphs Abs: 1.2 10*3/uL (ref 0.7–4.0)
MCHC: 33.4 g/dL (ref 30.0–36.0)
MCV: 93.3 fl (ref 78.0–100.0)
MONO ABS: 0.3 10*3/uL (ref 0.1–1.0)
MONOS PCT: 8.1 % (ref 3.0–12.0)
Neutro Abs: 2.4 10*3/uL (ref 1.4–7.7)
Neutrophils Relative %: 60.4 % (ref 43.0–77.0)
PLATELETS: 244 10*3/uL (ref 150.0–400.0)
RBC: 4.09 Mil/uL (ref 3.87–5.11)
RDW: 14.4 % (ref 11.5–15.5)
WBC: 3.9 10*3/uL — ABNORMAL LOW (ref 4.0–10.5)

## 2018-11-22 LAB — COMPREHENSIVE METABOLIC PANEL
ALK PHOS: 66 U/L (ref 39–117)
ALT: 14 U/L (ref 0–35)
AST: 22 U/L (ref 0–37)
Albumin: 4.7 g/dL (ref 3.5–5.2)
BILIRUBIN TOTAL: 0.8 mg/dL (ref 0.2–1.2)
BUN: 10 mg/dL (ref 6–23)
CO2: 29 mEq/L (ref 19–32)
CREATININE: 0.79 mg/dL (ref 0.40–1.20)
Calcium: 9.9 mg/dL (ref 8.4–10.5)
Chloride: 104 mEq/L (ref 96–112)
GFR: 93.15 mL/min (ref >60.00)
GLUCOSE: 99 mg/dL (ref 70–99)
Potassium: 4 mEq/L (ref 3.5–5.1)
Sodium: 140 mEq/L (ref 135–145)
Total Protein: 7.4 g/dL (ref 6.0–8.3)

## 2018-11-22 LAB — MICROALBUMIN / CREATININE URINE RATIO
Creatinine,U: 19.7 mg/dL
MICROALB/CREAT RATIO: 3.5 mg/g (ref 0.0–30.0)

## 2018-11-22 MED ORDER — VALACYCLOVIR HCL 1 G PO TABS: ORAL_TABLET | ORAL | 2 refills | Status: DC

## 2018-11-22 MED ORDER — AMLODIPINE BESYLATE 5 MG PO TABS: 5.0000 mg | ORAL_TABLET | Freq: Every day | ORAL | 3 refills | Status: DC

## 2018-11-22 NOTE — Progress Notes (Signed)
Patient: Nicole Long MRN: [id number] DOB: [birth year] PCP: Orma Flaming, MD     Subjective:  Chief Complaint  Patient presents with  . Hypertension    HPI: The patient is a 67 y.o. female who presents today for TOC from Dr. Anitra Lauth.  Pt states that she has "white coat symdrome" and has declined medication in the past for her blood pressure. She is very active and is unsure why her blood pressure is so high. She has history of mild HLD, GAD, GERD and vitamin D deficiency. Last labs checked were in May.   Hypertension: Here for follow up of hypertension.  Currently on on no medication . Home readings range from 562 ZHYQMVHQ/46 diastolic. She has not checked it in a long time. Exercise includes bike riding x20 minutes 3x/week, free weights 2x/week. Weight has been stable. Denies any chest pain, headaches, shortness of breath, vision changes, swelling in lower extremities. She thinks she has a FH of HTN in her mother. She has a lot of stress in her life and has GAD that she manages with life style changes.   She is also needing her mmg and is also due for her colonoscopy next month.   Review of Systems  Constitutional: Negative for fatigue.  Eyes: Negative for visual disturbance.  Respiratory: Negative for shortness of breath.   Cardiovascular: Negative for chest pain.  Gastrointestinal: Negative for abdominal pain and nausea.  Musculoskeletal: Negative for back pain and neck pain.  Neurological: Negative for dizziness and headaches.  Psychiatric/Behavioral: Negative for sleep disturbance.    Allergies Patient is allergic to hydrochlorothiazide.  Past Medical History Patient  has a past medical history of Abnormal EKG, Bicipital tendinitis of right shoulder (02/2017), Eczema, History of vitamin D deficiency, adenomatous colonic polyps, Hyperlipidemia (09/2016), IFG (impaired fasting glucose) (09/2016), Lipoma, Postmenopausal, and White coat syndrome without diagnosis of  hypertension.  Surgical History Patient  has a past surgical history that includes Lipoma excision (1990); Ganglion cyst excision (1988); Tubal ligation; Colonoscopy (11/18/2010); Bunionectomy (2010 and 2011); transthoracic echocardiogram (07/04/14); and DEXA (09/27/2016).  Family History [name]'s family history includes Cancer in her brother, mother, sister, and sister; Congestive Heart Failure in her mother; Heart disease in her brother; Kidney disease in her brother and sister; Stroke in her sister and sister; Thyroid disease in her son.  Social History Patient  reports that she quit smoking about 33 years ago. Her smoking use included cigarettes. She has never used smokeless tobacco. She reports current alcohol use of about 1.0 standard drinks of alcohol per week. She reports that she does not use drugs.    Objective: Vitals:   11/22/18 1047 11/22/18 1054 11/22/18 1145  BP: (!) 164/100 (!) 166/104 (!) 180/94  Pulse: 76    Temp: 98.1 F (36.7 C)    TempSrc: Oral    SpO2: 99%    Weight: 162 lb 3.2 oz (73.6 kg)    Height: 5' 5.75" (1.67 m)      Body mass index is 26.38 kg/m.  Physical Exam Vitals signs reviewed.  Constitutional:      Appearance: Normal appearance.  HENT:     Right Ear: Tympanic membrane, ear canal and external ear normal.     Left Ear: Tympanic membrane, ear canal and external ear normal.     Mouth/Throat:     Mouth: Mucous membranes are moist.  Eyes:     Extraocular Movements: Extraocular movements intact.     Pupils: Pupils are equal, round, and reactive to light.  Neck:     Musculoskeletal: Normal range of motion and neck supple.  Cardiovascular:     Rate and Rhythm: Normal rate and regular rhythm.     Pulses: Normal pulses.     Heart sounds: Normal heart sounds.  Pulmonary:     Effort: Pulmonary effort is normal.     Breath sounds: Normal breath sounds.  Abdominal:     General: Abdomen is flat. Bowel sounds are normal.     Palpations: Abdomen is  soft.  Neurological:     Mental Status: She is alert.        Assessment/plan: 1. Essential hypertension Starting her on norvasc 5mg . Will have her f/u in 2-4 weeks. aleady have ekg and will do labs/urine today. She is to keep a home log for me. If any symptoms or low readings she can 1/2 the tablets and let me know. Any other issues she is to let me know as well. Risks of uncontrolled HTN discussed, she seems more ready to start medication at this time.  - CBC with Differential/Platelet - Comprehensive metabolic panel - Microalbumin / creatinine urine ratio  2. Screening for malignant neoplasm of breast Handout given with mmg places. She can call and set thi sup.   3. Encounter for hepatitis C screening test for low risk patient  - Hepatitis C antibody  4. Screen for colon cancer  - Ambulatory referral to Gastroenterology    Return in about 3 weeks (around 12/13/2018) for 2-4 weeks for BP .   Orma Flaming, MD [place]   11/22/2018

## 2018-11-23 LAB — HEPATITIS C ANTIBODY
HEP C AB: NONREACTIVE
SIGNAL TO CUT-OFF: 0.02 (ref <1.00)

## 2018-11-30 ENCOUNTER — Other Ambulatory Visit (HOSPITAL_BASED_OUTPATIENT_CLINIC_OR_DEPARTMENT_OTHER): Payer: Self-pay | Admitting: Family Medicine

## 2018-11-30 DIAGNOSIS — Z1231 Encounter for screening mammogram for malignant neoplasm of breast: Secondary | ICD-10-CM

## 2018-12-03 ENCOUNTER — Encounter (HOSPITAL_BASED_OUTPATIENT_CLINIC_OR_DEPARTMENT_OTHER): Payer: Self-pay

## 2018-12-03 ENCOUNTER — Ambulatory Visit (HOSPITAL_BASED_OUTPATIENT_CLINIC_OR_DEPARTMENT_OTHER)
Admission: RE | Admit: 2018-12-03 | Discharge: 2018-12-03 | Disposition: A | Discharge status: Home / Self Care | Payer: Medicare Other | Source: Ambulatory Visit | Attending: Family Medicine | Admitting: Family Medicine

## 2018-12-03 DIAGNOSIS — Z1231 Encounter for screening mammogram for malignant neoplasm of breast: Secondary | ICD-10-CM

## 2018-12-06 ENCOUNTER — Ambulatory Visit (HOSPITAL_BASED_OUTPATIENT_CLINIC_OR_DEPARTMENT_OTHER): Payer: Medicare Other

## 2018-12-07 ENCOUNTER — Encounter: Payer: Self-pay | Admitting: Gastroenterology

## 2018-12-20 ENCOUNTER — Ambulatory Visit: Payer: Medicare Other | Admitting: Family Medicine

## 2018-12-25 ENCOUNTER — Ambulatory Visit: Payer: Medicare Other | Admitting: Gastroenterology

## 2019-01-01 ENCOUNTER — Ambulatory Visit: Payer: Medicare Other

## 2019-01-02 ENCOUNTER — Ambulatory Visit (INDEPENDENT_AMBULATORY_CARE_PROVIDER_SITE_OTHER): Payer: Medicare Other | Admitting: Family Medicine

## 2019-01-02 ENCOUNTER — Encounter: Payer: Self-pay | Admitting: Family Medicine

## 2019-01-02 VITALS — BP 164/90 | HR 94 | Temp 98.3°F | Ht 65.75 in | Wt 164.2 lb

## 2019-01-02 DIAGNOSIS — I10 Essential (primary) hypertension: Secondary | ICD-10-CM

## 2019-01-02 MED ORDER — AMLODIPINE BESYLATE 10 MG PO TABS: 10.0000 mg | ORAL_TABLET | Freq: Every day | ORAL | 3 refills | Status: DC

## 2019-01-02 NOTE — Progress Notes (Signed)
Patient: Nicole Long MRN: [id number] DOB: [birth year] PCP: Orma Flaming, MD     Subjective:  Chief Complaint  Patient presents with  . Hypertension    HPI: The patient is a 68 y.o. female who presents today for blood pressure follow up.   Hypertension: Here for follow up of hypertension.  Currently on norvasc, 5mg . Home readings range from 606-301 [id number]/55-73 dystolic. Takes medication as prescribed and denies any side effects. She takes medication at night. She takes her BP at home first thing in the AM.  Exercise includes walking. Weight has been stable. Denies any chest pain, headaches, shortness of breath, vision changes, swelling in lower extremities.    Review of Systems  Constitutional: Negative for fatigue.  Eyes: Negative for visual disturbance.  Respiratory: Negative for shortness of breath.   Cardiovascular: Negative for chest pain.  Gastrointestinal: Negative for abdominal pain and nausea.  Skin: Negative.   Neurological: Negative for dizziness and headaches.  Psychiatric/Behavioral: Negative for sleep disturbance. The patient is not nervous/anxious.     Allergies Patient is allergic to hydrochlorothiazide.  Past Medical History Patient  has a past medical history of Abnormal EKG, Bicipital tendinitis of right shoulder (02/2017), Eczema, History of vitamin D deficiency, adenomatous colonic polyps, Hyperlipidemia (09/2016), IFG (impaired fasting glucose) (09/2016), Lipoma, Postmenopausal, and White coat syndrome without diagnosis of hypertension.  Surgical History Patient  has a past surgical history that includes Lipoma excision (1990); Ganglion cyst excision (1988); Tubal ligation; Colonoscopy (11/18/2010); Bunionectomy (2010 and 2011); transthoracic echocardiogram (07/04/14); and DEXA (09/27/2016).  Family History [name]'s family history includes Cancer in her brother, mother, sister, and sister; Congestive Heart Failure in her mother; Heart disease in her  brother; Kidney disease in her brother and sister; Stroke in her sister and sister; Thyroid disease in her son.  Social History Patient  reports that she quit smoking about 33 years ago. Her smoking use included cigarettes. She has never used smokeless tobacco. She reports current alcohol use of about 1.0 standard drinks of alcohol per week. She reports that she does not use drugs.    Objective: Vitals:   01/02/19 1030 01/02/19 1051  BP: (!) 158/100 (!) 164/90  Pulse: 94   Temp: 98.3 F (36.8 C)   TempSrc: Oral   SpO2: 98%   Weight: 164 lb 3.2 oz (74.5 kg)   Height: 5' 5.75" (1.67 m)     Body mass index is 26.7 kg/m.  Physical Exam Vitals signs reviewed.  Constitutional:      Appearance: Normal appearance.  Neck:     Musculoskeletal: Normal range of motion and neck supple.  Cardiovascular:     Rate and Rhythm: Normal rate and regular rhythm.     Heart sounds: Normal heart sounds.  Pulmonary:     Effort: Pulmonary effort is normal.     Breath sounds: Normal breath sounds.  Abdominal:     General: Abdomen is flat. Bowel sounds are normal.     Palpations: Abdomen is soft.  Neurological:     General: No focal deficit present.     Mental Status: She is alert and oriented to person, place, and time.        Assessment/plan: Blood pressure is not to goal. We are going to increase her norvasc to 10mg  to hopefully get her diastolic to goal, otherwise her home readings are to goal systolic wise. Recommended routine exercise and healthy diet including DASH diet and mediterranean diet. Encouraged weight loss. F/u in 1 month.    Return  in about 1 month (around 02/02/2019).    Orma Flaming, MD [place]   01/02/2019

## 2019-01-02 NOTE — Patient Instructions (Signed)
Increasing your norvasc to 10mg . Can take 2 of the 5mg  pills until out, but I sent in a 10mg  pill for you to take so you only have to take one pill.   GOAL: less than 130-140/80.   See you back in one month

## 2019-01-11 ENCOUNTER — Encounter: Payer: Self-pay | Admitting: Gastroenterology

## 2019-01-11 ENCOUNTER — Ambulatory Visit (INDEPENDENT_AMBULATORY_CARE_PROVIDER_SITE_OTHER): Payer: Medicare Other | Admitting: Gastroenterology

## 2019-01-11 VITALS — BP 144/84 | HR 75 | Ht 65.5 in | Wt 165.5 lb

## 2019-01-11 DIAGNOSIS — Z86010 Personal history of colonic polyps: Secondary | ICD-10-CM | POA: Diagnosis not present

## 2019-01-11 NOTE — Patient Instructions (Signed)
If you are age 68 or older, your body mass index should be between 23-30. Your Body mass index is 27.12 kg/m. If this is out of the aforementioned range listed, please consider follow up with your Primary Care Provider.  If you are age 67 or younger, your body mass index should be between 19-25. Your Body mass index is 27.12 kg/m. If this is out of the aformentioned range listed, please consider follow up with your Primary Care Provider.    It has been recommended to you by your physician that you have a(n) Colonscopy completed. We did not schedule the procedure(s) today. Please contact our office at [phone]  to have the procedure completed.   Thank you,  Dr. Jackquline Denmark

## 2019-01-11 NOTE — Progress Notes (Signed)
Chief Complaint:   Referring Provider:  Orma Flaming, MD      ASSESSMENT AND PLAN;   #1. H/O polyps (colon 11/2010, neg colon 12/2013)  Plan: - Proceed with colonoscopy with miralax.  I have discussed the risks and benefits.  The risks including risk of perforation requiring laparotomy, bleeding after polypectomy requiring blood transfusions and risks of anesthesia/sedation were discussed.  Rare risks of missing colorectal neoplasms were also discussed. Consent forms were given for review.   HPI:    Nicole Long is a 68 y.o. female  No nausea, vomiting, heartburn, regurgitation, odynophagia or dysphagia.  No significant diarrhea or constipation. There is no melena or hematochezia. No unintentional weight loss.   Past Medical History:  Diagnosis Date  . Abnormal EKG    Inverted T waves in precordial leads: unchanged since 2006--this is her baseline  . Bicipital tendinitis of right shoulder 02/2017   PT  . Eczema   . History of vitamin D deficiency    Normal range with high dose replacement x 12 wks Jan 2019.  [name] Hx of adenomatous colonic polyps   . Hyperlipidemia 09/2016  . IFG (impaired fasting glucose) 09/2016   HbA1c 5.9%  . Lipoma   . Postmenopausal   . White coat syndrome without diagnosis of hypertension     Past Surgical History:  Procedure Laterality Date  . BUNIONECTOMY  2010 and 2011  . COLONOSCOPY  11/18/2010   Hx of adenomatous colon polyps.  Several colonoscopies in [place].  Most recent 12/24/13: no polyps.  Recall 5 yrs (Dr. Shana Chute, [facility]).  [name] DEXA  09/27/2016   NORMAL.  Repeat 2 yrs.  [name] GANGLION CYST EXCISION  1988  . LIPOMA EXCISION  1990  . TRANSTHORACIC ECHOCARDIOGRAM  07/04/14   Normal LV size and wall thickness, EF normal, grade I diast dysfxn  . TUBAL LIGATION      Family History  Problem Relation Age of Onset  . Congestive Heart Failure Mother   . Stroke Sister        passed away May 25, 2014  . Breast cancer Sister   . Cancer  Brother   . Thyroid disease Son   . Breast cancer Sister   . Stroke Sister   . Kidney disease Sister   . Kidney disease Brother   . Heart disease Brother        pacemaker  . Colon cancer Neg Hx     Social History   Tobacco Use  . Smoking status: Former Smoker    Types: Cigarettes    Last attempt to quit: 09/11/1985    Years since quitting: 33.3  . Smokeless tobacco: Never Used  Substance Use Topics  . Alcohol use: Yes    Alcohol/week: 1.0 standard drinks    Types: 1 Glasses of wine per week    Comment: socially  . Drug use: No    Current Outpatient Medications  Medication Sig Dispense Refill  . amLODipine (NORVASC) 10 MG tablet Take 1 tablet (10 mg total) by mouth daily. 90 tablet 3  . Ascorbic Acid (VITAMIN C PO) Take 1 tablet by mouth daily.    [name] b complex vitamins tablet Take 1 tablet by mouth daily.    . Cholecalciferol (VITAMIN D3) 2000 units TABS Take 1 tablet by mouth daily.    . Multiple Vitamin (MULTIVITAMIN) tablet Take 2 tablets by mouth daily.     . valACYclovir (VALTREX) 1000 MG tablet 2 tabs po q12h x 2 doses as  needed for fever blisters/cold sores 12 tablet 2   No current facility-administered medications for this visit.     Allergies  Allergen Reactions  . Hydrochlorothiazide Nausea Only    Fatigue, sick feeling, nausea    Review of Systems:  Constitutional: Denies fever, chills, diaphoresis, appetite change and fatigue.  HEENT: Denies photophobia, eye pain, redness, hearing loss, ear pain, congestion, sore throat, rhinorrhea, sneezing, mouth sores, neck pain, neck stiffness and tinnitus.   Respiratory: Denies SOB, DOE, cough, chest tightness,  and wheezing.   Cardiovascular: Denies chest pain, palpitations and leg swelling.  Genitourinary: Denies dysuria, urgency, frequency, hematuria, flank pain and difficulty urinating.  Musculoskeletal: Denies myalgias, back pain, joint swelling, arthralgias and gait problem.  Skin: No rash.  Neurological:  Denies dizziness, seizures, syncope, weakness, light-headedness, numbness and headaches.  Hematological: Denies adenopathy. Easy bruising, personal or family bleeding history  Psychiatric/Behavioral: No anxiety or depression     Physical Exam:    BP (!) 144/84   Pulse 75   Ht 5' 5.5" (1.664 m)   Wt 165 lb 8 oz (75.1 kg)   LMP  (LMP Unknown)   SpO2 98%   BMI 27.12 kg/m  Filed Weights   01/11/19 1017  Weight: 165 lb 8 oz (75.1 kg)   Constitutional:  Well-developed, in no acute distress. Psychiatric: Normal mood and affect. Behavior is normal. HEENT: Pupils normal.  Conjunctivae are normal. No scleral icterus. Neck supple.  Cardiovascular: Normal rate, regular rhythm. No edema Pulmonary/chest: Effort normal and breath sounds normal. No wheezing, rales or rhonchi. Abdominal: Soft, nondistended. Nontender. Bowel sounds active throughout. There are no masses palpable. No hepatomegaly. Rectal:  defered Neurological: Alert and oriented to person place and time. Skin: Skin is warm and dry. No rashes noted.  Data Reviewed: I have personally reviewed following labs and imaging studies  CBC: CBC Latest Ref Rng & Units 11/22/2018 03/14/2014  WBC 4.0 - 10.5 K/uL 3.9(L) 4.0  Hemoglobin 12.0 - 15.0 g/dL 12.7 12.3  Hematocrit 36.0 - 46.0 % 38.1 -  Platelets 150.0 - 400.0 K/uL 244.0 232    CMP: CMP Latest Ref Rng & Units 11/22/2018 04/19/2018 09/28/2016  Glucose 70 - 99 mg/dL 99 109(H) 105(H)  BUN 6 - 23 mg/dL 10 - -  Creatinine 0.40 - 1.20 mg/dL 0.79 - -  Sodium 135 - 145 mEq/L 140 - -  Potassium 3.5 - 5.1 mEq/L 4.0 - -  Chloride 96 - 112 mEq/L 104 - -  CO2 19 - 32 mEq/L 29 - -  Calcium 8.4 - 10.5 mg/dL 9.9 - -  Total Protein 6.0 - 8.3 g/dL 7.4 - -  Total Bilirubin 0.2 - 1.2 mg/dL 0.8 - -  Alkaline Phos 39 - 117 U/L 66 - -  AST 0 - 37 U/L 22 - -  ALT 0 - 35 U/L 14 - -    GFR: CrCl cannot be calculated (Patient's most recent lab result is older than the maximum 21 days  allowed.).    Carmell Austria, MD 01/11/2019, 10:43 AM  Cc: Orma Flaming, MD

## 2019-01-30 ENCOUNTER — Ambulatory Visit: Payer: Medicare Other | Admitting: Family Medicine

## 2019-02-06 ENCOUNTER — Ambulatory Visit (INDEPENDENT_AMBULATORY_CARE_PROVIDER_SITE_OTHER): Payer: Medicare Other | Admitting: Family Medicine

## 2019-02-06 ENCOUNTER — Encounter: Payer: Self-pay | Admitting: Family Medicine

## 2019-02-06 VITALS — BP 160/84 | HR 97 | Temp 98.3°F | Ht 65.5 in | Wt 167.0 lb

## 2019-02-06 DIAGNOSIS — I10 Essential (primary) hypertension: Secondary | ICD-10-CM | POA: Diagnosis not present

## 2019-02-06 NOTE — Progress Notes (Signed)
Patient: Nicole Long MRN: [id number] DOB: 1950/12/19 PCP: Orma Flaming, MD     Subjective:  Chief Complaint  Patient presents with  . Hypertension    HPI: The patient is a 68 y.o. female who presents today for hypertension f/u.   Hypertension: Here for follow up of hypertension.  Currently on norvasc 10mg  . Home readings range from 790-240 [id number]/24-26 diastolic. Takes medication as prescribed and denies any side effects. Exercise includes walking, gym. Weight has been stable. Denies any chest pain, headaches, shortness of breath, vision changes, swelling in lower extremities. She takes her medication at night. She took this last night. She always has high readings in the office. She has relaxed all morning.   Review of Systems  Constitutional: Negative for fatigue.  Eyes: Negative for visual disturbance.  Respiratory: Negative for shortness of breath.   Cardiovascular: Negative for chest pain.  Gastrointestinal: Negative for abdominal pain and nausea.  Neurological: Negative for dizziness and headaches.  Psychiatric/Behavioral: Negative for sleep disturbance.    Allergies Patient is allergic to hydrochlorothiazide.  Past Medical History Patient  has a past medical history of Abnormal EKG, Bicipital tendinitis of right shoulder (02/2017), Eczema, History of vitamin D deficiency, adenomatous colonic polyps, Hyperlipidemia (09/2016), IFG (impaired fasting glucose) (09/2016), Lipoma, Postmenopausal, and White coat syndrome without diagnosis of hypertension.  Surgical History Patient  has a past surgical history that includes Lipoma excision (1990); Ganglion cyst excision (1988); Tubal ligation; Colonoscopy (11/18/2010); Bunionectomy (2010 and 2011); transthoracic echocardiogram (07/04/14); and DEXA (09/27/2016).  Family History [name]'s family history includes Breast cancer in her sister and sister; Cancer in her brother; Congestive Heart Failure in her mother; Heart disease  in her brother; Kidney disease in her brother and sister; Stroke in her sister and sister; Thyroid disease in her son.  Social History Patient  reports that she quit smoking about 33 years ago. Her smoking use included cigarettes. She has never used smokeless tobacco. She reports current alcohol use of about 1.0 standard drinks of alcohol per week. She reports that she does not use drugs.    Objective: Vitals:   02/06/19 1037 02/06/19 1104  BP: (!) 158/100 (!) 160/84  Pulse: 97   Temp: 98.3 F (36.8 C)   TempSrc: Oral   SpO2: 100%   Weight: 167 lb (75.8 kg)   Height: 5' 5.5" (1.664 m)     Body mass index is 27.37 kg/m.  Physical Exam Vitals signs reviewed.  Constitutional:      Appearance: Normal appearance.  Neck:     Musculoskeletal: Normal range of motion and neck supple.  Cardiovascular:     Rate and Rhythm: Normal rate and regular rhythm.     Heart sounds: Normal heart sounds.  Pulmonary:     Effort: Pulmonary effort is normal.     Breath sounds: Normal breath sounds.  Abdominal:     General: Abdomen is flat. Bowel sounds are normal.     Palpations: Abdomen is soft.  Skin:    Capillary Refill: Capillary refill takes less than 2 seconds.  Neurological:     General: No focal deficit present.     Mental Status: She is alert and oriented to person, place, and time.  Psychiatric:        Mood and Affect: Mood normal.        Behavior: Behavior normal.        Assessment/plan: 1. HTN (hypertension), benign Above goal, but her home readings are all near to goal. We are going to  stay the course and continue home monitor. She will bring her BP cuff next visit to make sure calibrated correctly. Continue her healthy lifestyle and home log. She is to let me know if home readings become elevated consistently, otherwise see her back for routine f/u with labs in 6 months.    Return in about 7 months (around 09/07/2019) for htn/medicare annual/yearly .     Orma Flaming,  MD [place]  02/06/2019

## 2019-07-19 ENCOUNTER — Telehealth: Payer: Self-pay | Admitting: Family Medicine

## 2019-07-19 NOTE — Telephone Encounter (Signed)
I left a message asking the patient to call me at [phone] to schedule AWV with Courtney. VDM (Dee-Dee) °

## 2019-09-04 ENCOUNTER — Ambulatory Visit (INDEPENDENT_AMBULATORY_CARE_PROVIDER_SITE_OTHER): Payer: Medicare Other | Admitting: Family Medicine

## 2019-09-04 ENCOUNTER — Other Ambulatory Visit: Payer: Self-pay

## 2019-09-04 ENCOUNTER — Ambulatory Visit (INDEPENDENT_AMBULATORY_CARE_PROVIDER_SITE_OTHER): Payer: Medicare Other

## 2019-09-04 ENCOUNTER — Encounter: Payer: Self-pay | Admitting: Family Medicine

## 2019-09-04 VITALS — BP 130/70 | HR 104 | Temp 97.9°F | Wt 166.8 lb

## 2019-09-04 VITALS — BP 130/70 | Temp 97.9°F | Ht 66.0 in | Wt 166.9 lb

## 2019-09-04 DIAGNOSIS — Z0000 Encounter for general adult medical examination without abnormal findings: Secondary | ICD-10-CM | POA: Diagnosis not present

## 2019-09-04 DIAGNOSIS — Z23 Encounter for immunization: Secondary | ICD-10-CM | POA: Diagnosis not present

## 2019-09-04 DIAGNOSIS — Z1231 Encounter for screening mammogram for malignant neoplasm of breast: Secondary | ICD-10-CM

## 2019-09-04 DIAGNOSIS — I10 Essential (primary) hypertension: Secondary | ICD-10-CM

## 2019-09-04 LAB — CBC WITH DIFFERENTIAL/PLATELET
Basophils Absolute: 0 10*3/uL (ref 0.0–0.1)
Basophils Relative: 0.7 % (ref 0.0–3.0)
Eosinophils Absolute: 0 10*3/uL (ref 0.0–0.7)
Eosinophils Relative: 0.4 % (ref 0.0–5.0)
HCT: 38.3 % (ref 36.0–46.0)
Hemoglobin: 12.8 g/dL (ref 12.0–15.0)
Lymphocytes Relative: 26.6 % (ref 12.0–46.0)
Lymphs Abs: 1.1 10*3/uL (ref 0.7–4.0)
MCHC: 33.6 g/dL (ref 30.0–36.0)
MCV: 93.9 fl (ref 78.0–100.0)
Monocytes Absolute: 0.3 10*3/uL (ref 0.1–1.0)
Monocytes Relative: 7.4 % (ref 3.0–12.0)
Neutro Abs: 2.6 10*3/uL (ref 1.4–7.7)
Neutrophils Relative %: 64.9 % (ref 43.0–77.0)
Platelets: 232 10*3/uL (ref 150.0–400.0)
RBC: 4.08 Mil/uL (ref 3.87–5.11)
RDW: 13.3 % (ref 11.5–15.5)
WBC: 4 10*3/uL (ref 4.0–10.5)

## 2019-09-04 LAB — COMPREHENSIVE METABOLIC PANEL
ALT: 14 U/L (ref 0–35)
AST: 21 U/L (ref 0–37)
Albumin: 4.6 g/dL (ref 3.5–5.2)
Alkaline Phosphatase: 75 U/L (ref 39–117)
BUN: 11 mg/dL (ref 6–23)
CO2: 26 mEq/L (ref 19–32)
Calcium: 9.9 mg/dL (ref 8.4–10.5)
Chloride: 102 mEq/L (ref 96–112)
Creatinine, Ser: 0.75 mg/dL (ref 0.40–1.20)
GFR: 92.85 mL/min (ref >60.00)
Glucose, Bld: 104 mg/dL — ABNORMAL HIGH (ref 70–99)
Potassium: 3.6 mEq/L (ref 3.5–5.1)
Sodium: 139 mEq/L (ref 135–145)
Total Bilirubin: 0.9 mg/dL (ref 0.2–1.2)
Total Protein: 7.5 g/dL (ref 6.0–8.3)

## 2019-09-04 LAB — LIPID PANEL
Cholesterol: 195 mg/dL (ref 0–200)
HDL: 72.4 mg/dL (ref >39.00)
LDL Cholesterol: 111 mg/dL — ABNORMAL HIGH (ref 0–99)
NonHDL: 122.57
Total CHOL/HDL Ratio: 3
Triglycerides: 57 mg/dL (ref 0.0–149.0)
VLDL: 11.4 mg/dL (ref 0.0–40.0)

## 2019-09-04 LAB — MICROALBUMIN / CREATININE URINE RATIO
Creatinine,U: 116.8 mg/dL
Microalb Creat Ratio: 1.1 mg/g (ref 0.0–30.0)
Microalb, Ur: 1.3 mg/dL (ref 0.0–1.9)

## 2019-09-04 NOTE — Progress Notes (Signed)
I have reviewed the documentation from the recent AWV done by Courtney Slade; I agree with the documentation and will follow up on any recommendations or abnormal findings as suggested. Tayvia Faughnan, MD [facility]    

## 2019-09-04 NOTE — Progress Notes (Signed)
Patient: Nicole Long MRN: [id number] DOB: 08/05/1951 PCP: Orma Flaming, MD     Subjective:  Chief Complaint  Patient presents with  . Hypertension    HPI: The patient is a 68 y.o. female who presents today for hypertension follow up.   Hypertension: Here for follow up of hypertension.  Currently on norvasc 10mg  . Home readings range from [id number] diastolic. Takes medication as prescribed and denies any side effects. Exercise includes none due to Covid. Trying to start back with bike and working out. Weight has been stable. Denies any chest pain, headaches, shortness of breath, vision changes, swelling in lower extremities. She has her home log and is always elevated here in the office. Cuff checked and is calibrated correctly.   Due for second pneumonia shot and flu shot.   Also due for cscope. She saw her GI doc in January. She will call to set this up.   Review of Systems  Constitutional: Negative for chills, fatigue and fever.  HENT: Negative for dental problem, ear pain, hearing loss and trouble swallowing.   Eyes: Negative for visual disturbance.  Respiratory: Negative for cough, chest tightness and shortness of breath.   Cardiovascular: Negative for chest pain, palpitations and leg swelling.  Gastrointestinal: Negative for abdominal pain, blood in stool, diarrhea and nausea.  Endocrine: Negative for cold intolerance, polydipsia, polyphagia and polyuria.  Genitourinary: Negative for dysuria and hematuria.  Musculoskeletal: Negative for arthralgias.  Skin: Negative for rash.  Neurological: Negative for dizziness and headaches.  Psychiatric/Behavioral: Negative for dysphoric mood and sleep disturbance. The patient is not nervous/anxious.     Allergies Patient is allergic to hydrochlorothiazide.  Past Medical History Patient  has a past medical history of Abnormal EKG, Bicipital tendinitis of right shoulder (02/2017), Eczema, History of vitamin D  deficiency, adenomatous colonic polyps, Hyperlipidemia (09/2016), IFG (impaired fasting glucose) (09/2016), Lipoma, Postmenopausal, and White coat syndrome without diagnosis of hypertension.  Surgical History Patient  has a past surgical history that includes Lipoma excision (1990); Ganglion cyst excision (1988); Tubal ligation; Colonoscopy (11/18/2010); Bunionectomy (2010 and 2011); transthoracic echocardiogram (07/04/14); and DEXA (09/27/2016).  Family History [name]'s family history includes Breast cancer in her sister and sister; Cancer in her brother; Congestive Heart Failure in her mother; Heart disease in her brother; Kidney disease in her brother and sister; Stroke in her sister and sister; Thyroid disease in her son.  Social History Patient  reports that she quit smoking about 34 years ago. Her smoking use included cigarettes. She has never used smokeless tobacco. She reports current alcohol use of about 1.0 standard drinks of alcohol per week. She reports that she does not use drugs.    Objective: Vitals:   09/04/19 1047 09/04/19 1115  BP: (!) 150/92 130/70  Pulse: (!) 104   Temp: 97.9 F (36.6 C)   SpO2: 99%   Weight: 166 lb 12.8 oz (75.7 kg)     Body mass index is 27.33 kg/m.  Physical Exam Vitals signs reviewed.  Constitutional:      Appearance: Normal appearance. She is well-developed.  HENT:     Head: Normocephalic and atraumatic.     Right Ear: Tympanic membrane, ear canal and external ear normal.     Left Ear: Tympanic membrane, ear canal and external ear normal.     Nose: Nose normal.     Mouth/Throat:     Mouth: Mucous membranes are moist.  Eyes:     Extraocular Movements: Extraocular movements intact.     Conjunctiva/sclera:  Conjunctivae normal.     Pupils: Pupils are equal, round, and reactive to light.  Neck:     Musculoskeletal: Normal range of motion and neck supple.     Thyroid: No thyromegaly.  Cardiovascular:     Rate and Rhythm: Normal rate and  regular rhythm.     Pulses: Normal pulses.     Heart sounds: Normal heart sounds. No murmur.  Pulmonary:     Effort: Pulmonary effort is normal.     Breath sounds: Normal breath sounds.  Abdominal:     General: Abdomen is flat. Bowel sounds are normal. There is no distension.     Palpations: Abdomen is soft.     Tenderness: There is no abdominal tenderness.  Lymphadenopathy:     Cervical: No cervical adenopathy.  Skin:    General: Skin is warm and dry.     Findings: No rash.  Neurological:     General: No focal deficit present.     Mental Status: She is alert and oriented to person, place, and time.     Cranial Nerves: No cranial nerve deficit.     Coordination: Coordination normal.     Deep Tendon Reflexes: Reflexes normal.  Psychiatric:        Mood and Affect: Mood normal.        Behavior: Behavior normal.        Assessment/plan: 1. HTN (hypertension), benign Blood pressure is to goal. Continue current anti-hypertensive medications. Refills not given and routine lab work will be done today. Recommended routine exercise and healthy diet including DASH diet and mediterranean diet. Encouraged weight loss. F/u in 6 months.   - CBC with Differential/Platelet - Comprehensive metabolic panel - Lipid panel - Microalbumin / creatinine urine ratio  2. Need for vaccination for Strep pneumoniae  - Pneumococcal polysaccharide vaccine 23-valent greater than or equal to 2yo subcutaneous/IM   Flu shot today as well. Also recommended she go ahead and get cscope done and will get in touch with her GI doc to do this. Consult has already been done.    Return in about 6 months (around 03/03/2020).     Orma Flaming, MD [place]  09/04/2019

## 2019-09-04 NOTE — Progress Notes (Signed)
Subjective:   Nicole Long Long is a 68 y.o. female who presents for Medicare Annual (Subsequent) preventive examination.  Review of Systems:   Cardiac Risk Factors include: hypertension;advanced age (>76men, >62 women)     Objective:     Vitals: BP 130/70   Temp 97.9 F (36.6 C) (Temporal)   Ht 5\' 6"  (1.676 m)   Wt 166 lb 14.2 oz (75.7 kg)   LMP  (LMP Unknown)   BMI 26.94 kg/m   Body mass index is 26.94 kg/m.  Advanced Directives 09/04/2019 11/28/2017  Does Patient Have a Medical Advance Directive? No No  Would patient like information on creating a medical advance directive? Yes (MAU/Ambulatory/Procedural Areas - Information given) Yes (MAU/Ambulatory/Procedural Areas - Information given)    Tobacco Social History   Tobacco Use  Smoking Status Former Smoker  . Types: Cigarettes  . Quit date: 09/11/1985  . Years since quitting: 34.0  Smokeless Tobacco Never Used     Counseling given: Not Answered   Clinical Intake:  Pre-visit preparation completed: Yes           How often do you need to have someone help you when you read instructions, pamphlets, or other written materials from your doctor or pharmacy?: 1 - Never  Interpreter Needed?: No  Information entered by :: Denman George LPN  Past Medical History:  Diagnosis Date  . Abnormal EKG    Inverted T waves in precordial leads: unchanged since 2006--this is her baseline  . Bicipital tendinitis of right shoulder 02/2017   PT  . Eczema   . History of vitamin D deficiency    Normal range with high dose replacement x 12 wks Jan 2019.  [name] Hx of adenomatous colonic polyps   . Hyperlipidemia 09/2016  . IFG (impaired fasting glucose) 09/2016   HbA1c 5.9%  . Lipoma   . Postmenopausal   . White coat syndrome without diagnosis of hypertension    Past Surgical History:  Procedure Laterality Date  . BUNIONECTOMY  2010 and 2011  . COLONOSCOPY  11/18/2010   Hx of adenomatous colon polyps.  Several  colonoscopies in [place].  Most recent 12/24/13: no polyps.  Recall 5 yrs (Dr. Shana Chute, [facility]).  [name] DEXA  09/27/2016   NORMAL.  Repeat 2 yrs.  [name] GANGLION CYST EXCISION  1988  . LIPOMA EXCISION  1990  . TRANSTHORACIC ECHOCARDIOGRAM  07/04/14   Normal LV size and wall thickness, EF normal, grade I diast dysfxn  . TUBAL LIGATION     Family History  Problem Relation Age of Onset  . Congestive Heart Failure Mother   . Stroke Sister        passed away 2014/05/19  . Breast cancer Sister   . Cancer Brother   . Thyroid disease Son   . Breast cancer Sister   . Stroke Sister   . Kidney disease Sister   . Kidney disease Brother   . Heart disease Brother        pacemaker  . Colon cancer Neg Hx    Social History   Socioeconomic History  . Marital status: Married    Spouse name: Not on file  . Number of children: Not on file  . Years of education: Not on file  . Highest education level: Not on file  Occupational History  . Not on file  Social Needs  . Financial resource strain: Not on file  . Food insecurity    Worry: Not on file  Inability: Not on file  . Transportation needs    Medical: Not on file    Non-medical: Not on file  Tobacco Use  . Smoking status: Former Smoker    Types: Cigarettes    Quit date: 09/11/1985    Years since quitting: 34.0  . Smokeless tobacco: Never Used  Substance and Sexual Activity  . Alcohol use: Yes    Alcohol/week: 1.0 standard drinks    Types: 1 Glasses of wine per week    Comment: socially  . Drug use: No  . Sexual activity: Not on file  Lifestyle  . Physical activity    Days per week: Not on file    Minutes per session: Not on file  . Stress: Not on file  Relationships  . Social Herbalist on phone: Not on file    Gets together: Not on file    Attends religious service: Not on file    Active member of club or organization: Not on file    Attends meetings of clubs or organizations: Not on file    Relationship  status: Not on file  Other Topics Concern  . Not on file  Social History Narrative   Relocated to [facility] 2010 (has PMD in [place], [place]).   Married, has 78 y/o son and one step grandchild.   Retired Social worker.   Occ alc, no tob/drugs.    Outpatient Encounter Medications as of 09/04/2019  Medication Sig  . amLODipine (NORVASC) 10 MG tablet Take 1 tablet (10 mg total) by mouth daily.  . Ascorbic Acid (VITAMIN C PO) Take 1 tablet by mouth daily.  [name] b complex vitamins tablet Take 1 tablet by mouth daily.  . Cholecalciferol (VITAMIN D3) 2000 units TABS Take 1 tablet by mouth daily.  . Multiple Vitamin (MULTIVITAMIN) tablet Take 2 tablets by mouth daily.   . valACYclovir (VALTREX) 1000 MG tablet 2 tabs po q12h x 2 doses as needed for fever blisters/cold sores   No facility-administered encounter medications on file as of 09/04/2019.     Activities of Daily Living In your present state of health, do you have any difficulty performing the following activities: 09/04/2019  Hearing? N  Vision? N  Difficulty concentrating or making decisions? N  Walking or climbing stairs? N  Dressing or bathing? N  Doing errands, shopping? N  Preparing Food and eating ? N  Using the Toilet? N  In the past six months, have you accidently leaked urine? N  Do you have problems with loss of bowel control? N  Managing your Medications? N  Managing your Finances? N  Housekeeping or managing your Housekeeping? N  Some recent data might be hidden    Patient Care Team: Orma Flaming, MD as PCP - General (Family Medicine) Fay Records, MD as Consulting Physician (Cardiology) Hale Bogus., MD as Consulting Physician (Gastroenterology) Jackquline Denmark, MD as Consulting Physician (Gastroenterology)    Assessment:   This is a routine wellness examination for [name].  Exercise Activities and Dietary recommendations Current Exercise Habits: Home exercise routine, Type of exercise: Other - see  comments;strength training/weights(stationary bike), Time (Minutes): 45, Frequency (Times/Week): 5, Weekly Exercise (Minutes/Week): 225, Intensity: Mild  Goals    . Increase physical activity     Increase activity and continue healthy eating.        Fall Risk Fall Risk  09/04/2019 11/28/2017 09/13/2016 09/13/2016  Falls in the past year? 0 No No No  Number falls in past  yr: 0 - - -  Injury with Fall? 0 - - -  Follow up Falls evaluation completed;Education provided;Falls prevention discussed - - -   Is the patient's home free of loose throw rugs in walkways, pet beds, electrical cords, etc?   yes      Grab bars in the bathroom? yes      Handrails on the stairs?   yes      Adequate lighting?   yes  Timed Get Up and Go performed: completed and within normal timeframe; no gait abnormalities noted    Depression Screen PHQ 2/9 Scores 09/04/2019 11/28/2017 09/13/2016 09/13/2016  PHQ - 2 Score 0 0 0 0     Cognitive Function     6CIT Screen 09/04/2019  What Year? 0 points  What month? 0 points  What time? 0 points  Count back from 20 0 points  Months in reverse 0 points  Repeat phrase 0 points  Total Score 0    Immunization History  Administered Date(s) Administered  . Fluad Quad(high Dose 65+) 09/04/2019  . Influenza Split 09/10/2008, 10/06/2009  . Influenza, High Dose Seasonal PF 09/13/2016, 10/04/2017, 10/30/2018  . Influenza, Seasonal, Injecte, Preservative Fre 09/24/2014  . Influenza,inj,Quad PF,6+ Mos 09/11/2013  . Influenza,inj,quad, With Preservative 12/09/2015  . Pneumococcal Conjugate-13 04/19/2018  . Pneumococcal Polysaccharide-23 09/04/2019  . Tdap 09/11/2013  . Varicella 03/06/2014    Qualifies for Shingles Vaccine?Discussed and patient will check with pharmacy for coverage.  Patient education handout provided    Screening Tests Health Maintenance  Topic Date Due  . COLONOSCOPY  12/24/2018  . MAMMOGRAM  12/03/2020  . TETANUS/TDAP  09/12/2023  . INFLUENZA  VACCINE  Completed  . DEXA SCAN  Completed  . Hepatitis C Screening  Completed  . PNA vac Low Risk Adult  Completed    Cancer Screenings: Lung: Low Dose CT Chest recommended if Age 81-80 years, 30 pack-year currently smoking OR have quit w/in 15years. Patient does not qualify. Breast:  Up to date on Mammogram? Yes; orders place for repeat after 12/03/18 Up to date of Bone Density/Dexa? Yes Colorectal: last colonoscopy with Dr. Shana Chute with [facility] 12/24/13; patient has been in contact and will schedule repeat    Plan:  I have personally reviewed and addressed the Medicare Annual Wellness questionnaire and have noted the following in the patient's chart:  A. Medical and social history B. Use of alcohol, tobacco or illicit drugs  C. Current medications and supplements D. Functional ability and status E.  Nutritional status F.  Physical activity G. Advance directives H. List of other physicians I.  Hospitalizations, surgeries, and ER visits in previous 12 months J.  [place] such as hearing and vision if needed, cognitive and depression L. Referrals, records requested, and appointments- mammogram ordered   In addition, I have reviewed and discussed with patient certain preventive protocols, quality metrics, and best practice recommendations. A written personalized care plan for preventive services as well as general preventive health recommendations were provided to patient.   Signed,  Denman George, LPN  Nurse Health Advisor   Nurse Notes: no additional

## 2019-09-04 NOTE — Patient Instructions (Addendum)
Nicole Long , Thank you for taking time to come for your Medicare Wellness Visit. I appreciate your ongoing commitment to your health goals. Please review the following plan we discussed and let me know if I can assist you in the future.   Screening recommendations/referrals: Colorectal Screening: colonoscopy up to date; last 12/24/13 with Dr. Shana Chute Mammogram: last 12/03/18 the number for the [facility] is [phone] Bone Density: last 09/27/16  Vision and Dental Exams: Recommended annual ophthalmology exams for early detection of glaucoma and other disorders of the eye Recommended annual dental exams for proper oral hygiene  Vaccinations: Influenza vaccine: today Pneumococcal vaccine: Pneumovax 23 today Tdap vaccine: up to date; last 09/11/13  Shingles vaccine: Please call your insurance company to determine your out of pocket expense for the Shingrix vaccine. You may receive this vaccine at your local pharmacy.  Advanced directives: Advance directives discussed with you today. I have provided a copy for you to complete at home and have notarized. Once this is complete please bring a copy in to our office so we can scan it into your chart.  Goals: Recommend to drink at least 6-8 8oz glasses of water per day and recommend to begin DASH diet as directed below  Next appointment: Please schedule your Annual Wellness Visit with your Nurse Health Advisor in one year.  Preventive Care 50 Years and Older, Female Preventive care refers to lifestyle choices and visits with your health care provider that can promote health and wellness. What does preventive care include?  A yearly physical exam. This is also called an annual well check.  Dental exams once or twice a year.  Routine eye exams. Ask your health care provider how often you should have your eyes checked.  Personal lifestyle choices, including:  Daily care of your teeth and gums.  Regular physical activity.  Eating a  healthy diet.  Avoiding tobacco and drug use.  Limiting alcohol use.  Practicing safe sex.  Taking low-dose aspirin every day if recommended by your health care provider.  Taking vitamin and mineral supplements as recommended by your health care provider. What happens during an annual well check? The services and screenings done by your health care provider during your annual well check will depend on your age, overall health, lifestyle risk factors, and family history of disease. Counseling  Your health care provider may ask you questions about your:  Alcohol use.  Tobacco use.  Drug use.  Emotional well-being.  Home and relationship well-being.  Sexual activity.  Eating habits.  History of falls.  Memory and ability to understand (cognition).  Work and work Statistician.  Reproductive health. Screening  You may have the following tests or measurements:  Height, weight, and BMI.  Blood pressure.  Lipid and cholesterol levels. These may be checked every 5 years, or more frequently if you are over 30 years old.  Skin check.  Lung cancer screening. You may have this screening every year starting at age 88 if you have a 30-pack-year history of smoking and currently smoke or have quit within the past 15 years.  Fecal occult blood test (FOBT) of the stool. You may have this test every year starting at age 89.  Flexible sigmoidoscopy or colonoscopy. You may have a sigmoidoscopy every 5 years or a colonoscopy every 10 years starting at age 69.  Hepatitis C blood test.  Hepatitis B blood test.  Sexually transmitted disease (STD) testing.  Diabetes screening. This is done by checking your blood  sugar (glucose) after you have not eaten for a while (fasting). You may have this done every 1-3 years.  Bone density scan. This is done to screen for osteoporosis. You may have this done starting at age 78.  Mammogram. This may be done every 1-2 years. Talk to your health  care provider about how often you should have regular mammograms. Talk with your health care provider about your test results, treatment options, and if necessary, the need for more tests. Vaccines  Your health care provider may recommend certain vaccines, such as:  Influenza vaccine. This is recommended every year.  Tetanus, diphtheria, and acellular pertussis (Tdap, Td) vaccine. You may need a Td booster every 10 years.  Zoster vaccine. You may need this after age 3.  Pneumococcal 13-valent conjugate (PCV13) vaccine. One dose is recommended after age 57.  Pneumococcal polysaccharide (PPSV23) vaccine. One dose is recommended after age 51. Talk to your health care provider about which screenings and vaccines you need and how often you need them. This information is not intended to replace advice given to you by your health care provider. Make sure you discuss any questions you have with your health care provider. Document Released: 12/25/2015 Document Revised: 08/17/2016 Document Reviewed: 09/29/2015 Elsevier Interactive Patient Education  2017 [employer] Prevention in the Home Falls can cause injuries. They can happen to people of all ages. There are many things you can do to make your home safe and to help prevent falls. What can I do on the outside of my home?  Regularly fix the edges of walkways and driveways and fix any cracks.  Remove anything that might make you trip as you walk through a door, such as a raised step or threshold.  Trim any bushes or trees on the path to your home.  Use bright outdoor lighting.  Clear any walking paths of anything that might make someone trip, such as rocks or tools.  Regularly check to see if handrails are loose or broken. Make sure that both sides of any steps have handrails.  Any raised decks and porches should have guardrails on the edges.  Have any leaves, snow, or ice cleared regularly.  Use sand or salt on walking paths  during winter.  Clean up any spills in your garage right away. This includes oil or grease spills. What can I do in the bathroom?  Use night lights.  Install grab bars by the toilet and in the tub and shower. Do not use towel bars as grab bars.  Use non-skid mats or decals in the tub or shower.  If you need to sit down in the shower, use a plastic, non-slip stool.  Keep the floor dry. Clean up any water that spills on the floor as soon as it happens.  Remove soap buildup in the tub or shower regularly.  Attach bath mats securely with double-sided non-slip rug tape.  Do not have throw rugs and other things on the floor that can make you trip. What can I do in the bedroom?  Use night lights.  Make sure that you have a light by your bed that is easy to reach.  Do not use any sheets or blankets that are too big for your bed. They should not hang down onto the floor.  Have a firm chair that has side arms. You can use this for support while you get dressed.  Do not have throw rugs and other things on the floor that can  make you trip. What can I do in the kitchen?  Clean up any spills right away.  Avoid walking on wet floors.  Keep items that you use a lot in easy-to-reach places.  If you need to reach something above you, use a strong step stool that has a grab bar.  Keep electrical cords out of the way.  Do not use floor polish or wax that makes floors slippery. If you must use wax, use non-skid floor wax.  Do not have throw rugs and other things on the floor that can make you trip. What can I do with my stairs?  Do not leave any items on the stairs.  Make sure that there are handrails on both sides of the stairs and use them. Fix handrails that are broken or loose. Make sure that handrails are as long as the stairways.  Check any carpeting to make sure that it is firmly attached to the stairs. Fix any carpet that is loose or worn.  Avoid having throw rugs at the top  or bottom of the stairs. If you do have throw rugs, attach them to the floor with carpet tape.  Make sure that you have a light switch at the top of the stairs and the bottom of the stairs. If you do not have them, ask someone to add them for you. What else can I do to help prevent falls?  Wear shoes that:  Do not have high heels.  Have rubber bottoms.  Are comfortable and fit you well.  Are closed at the toe. Do not wear sandals.  If you use a stepladder:  Make sure that it is fully opened. Do not climb a closed stepladder.  Make sure that both sides of the stepladder are locked into place.  Ask someone to hold it for you, if possible.  Clearly mark and make sure that you can see:  Any grab bars or handrails.  First and last steps.  Where the edge of each step is.  Use tools that help you move around (mobility aids) if they are needed. These include:  Canes.  Walkers.  Scooters.  Crutches.  Turn on the lights when you go into a dark area. Replace any light bulbs as soon as they burn out.  Set up your furniture so you have a clear path. Avoid moving your furniture around.  If any of your floors are uneven, fix them.  If there are any pets around you, be aware of where they are.  Review your medicines with your doctor. Some medicines can make you feel dizzy. This can increase your chance of falling. Ask your doctor what other things that you can do to help prevent falls. This information is not intended to replace advice given to you by your health care provider. Make sure you discuss any questions you have with your health care provider. Document Released: 09/24/2009 Document Revised: 05/05/2016 Document Reviewed: 01/02/2015 Elsevier Interactive Patient Education  2017 [employer].

## 2019-09-05 ENCOUNTER — Other Ambulatory Visit: Payer: Self-pay | Admitting: Family Medicine

## 2019-09-05 DIAGNOSIS — R739 Hyperglycemia, unspecified: Secondary | ICD-10-CM

## 2019-09-09 ENCOUNTER — Encounter: Payer: Self-pay | Admitting: Family Medicine

## 2019-09-09 ENCOUNTER — Other Ambulatory Visit (INDEPENDENT_AMBULATORY_CARE_PROVIDER_SITE_OTHER): Payer: Medicare Other

## 2019-09-09 DIAGNOSIS — R7303 Prediabetes: Secondary | ICD-10-CM | POA: Insufficient documentation

## 2019-09-09 DIAGNOSIS — R739 Hyperglycemia, unspecified: Secondary | ICD-10-CM | POA: Diagnosis not present

## 2019-09-09 LAB — HEMOGLOBIN A1C: Hgb A1c MFr Bld: 5.8 % (ref 4.6–6.5)

## 2019-09-10 ENCOUNTER — Encounter: Payer: Self-pay | Admitting: Gastroenterology

## 2019-10-03 ENCOUNTER — Telehealth: Payer: Self-pay

## 2019-10-03 ENCOUNTER — Telehealth: Payer: Self-pay | Admitting: Gastroenterology

## 2019-10-03 NOTE — Telephone Encounter (Signed)
Pt reported that she received a letter for jury duty in November.  She is scheduled for a colonoscopy and asked for a doctor's note to get out of jury duty.

## 2019-10-03 NOTE — Telephone Encounter (Signed)
Copied from [facility] [id number]. Topic: General - Other >> Oct 03, 2019  1:18 PM Yvette Rack wrote: Reason for CRM: Pt stated she received a jury summons and she needs to speak with Dr. Rogers Blocker to see if it a good idea for her to proceed with her health problems. Pt requests call back. Cb# [phone]

## 2019-10-03 NOTE — Telephone Encounter (Signed)
Please give him a note and copy of the appointment which was made several weeks ago. Thx  RG

## 2019-10-03 NOTE — Telephone Encounter (Signed)
Please advise as patient is scheduled for colon on 10/23/2019

## 2019-10-03 NOTE — Telephone Encounter (Signed)
Please see message below and advise . Thanks

## 2019-10-04 NOTE — Telephone Encounter (Signed)
Let her know as long as they require masks/social distancing and frequent hand washing she should be fine. She is doing well health wise and biggest risk would be age. If she is completely uncomfortable with this, I can write her a note.   Orma Flaming, MD [place]

## 2019-10-04 NOTE — Telephone Encounter (Signed)
Called and spoke with patient-patient informed of note being processed for her to pick up or have mailed to her with appt dates as well; Patient verified address and OV dates with letter mailed to patient per patient request; Patient advised to call back to the office at [phone] should questions/concerns arise; Patient verbalized understanding of information/instructions;

## 2019-10-04 NOTE — Telephone Encounter (Signed)
Advised patient of notes per Dr. Rogers Blocker.  She verbalized understanding and reports that she already r/c note from Dr. Steve Rattler office (she had upcoming colonscopy scheduled) excusing her from jury duty.

## 2019-10-09 ENCOUNTER — Other Ambulatory Visit: Payer: Self-pay

## 2019-10-09 ENCOUNTER — Ambulatory Visit (AMBULATORY_SURGERY_CENTER): Payer: Self-pay | Admitting: *Deleted

## 2019-10-09 VITALS — Temp 97.3°F | Ht 65.5 in | Wt 167.0 lb

## 2019-10-09 DIAGNOSIS — Z1159 Encounter for screening for other viral diseases: Secondary | ICD-10-CM

## 2019-10-09 DIAGNOSIS — Z86010 Personal history of colonic polyps: Secondary | ICD-10-CM

## 2019-10-09 NOTE — Progress Notes (Signed)
Patient is here in-person for PV. Patient denies any allergies to eggs or soy. Patient denies any problems with anesthesia/sedation. Patient denies any oxygen use at home. Patient denies taking any diet/weight loss medications or blood thinners. Patient is not being treated for MRSA or C-diff. EMMI education assisgned to the patient for the procedure, this was explained and instructions given to patient. COVID-19 screening test is on 10/18/2019 10:40 am, the pt is aware.   Pt is aware that care partner will wait in the car during procedure; if they feel like they will be too hot or cold to wait in the car; they may wait in the 4 th floor lobby. Patient is aware to bring only one care partner. We want them to wear a mask (we do not have any that we can provide them), practice social distancing, and we will check their temperatures when they get here.  I did remind the patient that their care partner needs to stay in the parking lot the entire time and have a cell phone available, we will call them when the pt is ready for discharge. Patient will wear mask into building.

## 2019-10-18 ENCOUNTER — Other Ambulatory Visit: Payer: Self-pay | Admitting: Gastroenterology

## 2019-10-18 DIAGNOSIS — Z1159 Encounter for screening for other viral diseases: Secondary | ICD-10-CM | POA: Diagnosis not present

## 2019-10-18 LAB — SARS CORONAVIRUS 2 (TAT 6-24 HRS): SARS Coronavirus 2: NEGATIVE

## 2019-10-22 ENCOUNTER — Telehealth: Payer: Self-pay | Admitting: Gastroenterology

## 2019-10-22 NOTE — Telephone Encounter (Signed)
Hi Dr. Lyndel Safe, This patient just reschedule her colonoscopy that was scheduled for tomorrow due to the weather forecast for tomorrow. She has rescheduled to Friday. Thank you.

## 2019-10-23 ENCOUNTER — Encounter: Payer: Medicare Other | Admitting: Gastroenterology

## 2019-10-25 ENCOUNTER — Ambulatory Visit (AMBULATORY_SURGERY_CENTER): Payer: Medicare Other | Admitting: Gastroenterology

## 2019-10-25 ENCOUNTER — Other Ambulatory Visit: Payer: Self-pay

## 2019-10-25 ENCOUNTER — Encounter: Payer: Self-pay | Admitting: Gastroenterology

## 2019-10-25 VITALS — BP 110/70 | HR 63 | Temp 98.6°F | Resp 15 | Ht 65.5 in | Wt 167.0 lb

## 2019-10-25 DIAGNOSIS — Z86010 Personal history of colonic polyps: Secondary | ICD-10-CM

## 2019-10-25 DIAGNOSIS — D122 Benign neoplasm of ascending colon: Secondary | ICD-10-CM | POA: Diagnosis not present

## 2019-10-25 MED ORDER — SODIUM CHLORIDE 0.9 % IV SOLN: 500.0000 mL | Freq: Once | INTRAVENOUS | Status: DC

## 2019-10-25 NOTE — Progress Notes (Signed)
Called to room to assist during endoscopic procedure.  Patient ID and intended procedure confirmed with present staff. Received instructions for my participation in the procedure from the performing physician.  

## 2019-10-25 NOTE — Progress Notes (Signed)
PT taken to PACU. Monitors in place. VSS. Report given to RN. 

## 2019-10-25 NOTE — Op Note (Signed)
[facility] Patient Name: Nicole Long Procedure Date: 10/25/2019 8:28 AM MRN: [id number] Endoscopist: Jackquline Denmark , MD Age: 68 Referring MD:  Date of Birth: [birth year] Gender: Female Account #: [id number] Procedure:                Colonoscopy Indications:              High risk colon cancer surveillance: Personal                            history of colonic polyps Medicines:                Monitored Anesthesia Care Procedure:                Pre-Anesthesia Assessment:                           - Prior to the procedure, a History and Physical                            was performed, and patient medications and                            allergies were reviewed. The patient's tolerance of                            previous anesthesia was also reviewed. The risks                            and benefits of the procedure and the sedation                            options and risks were discussed with the patient.                            All questions were answered, and informed consent                            was obtained. Prior Anticoagulants: The patient has                            taken no previous anticoagulant or antiplatelet                            agents. ASA Grade Assessment: II - A patient with                            mild systemic disease. After reviewing the risks                            and benefits, the patient was deemed in                            satisfactory condition to undergo the procedure.  After obtaining informed consent, the colonoscope                            was passed under direct vision. Throughout the                            procedure, the patient's blood pressure, pulse, and                            oxygen saturations were monitored continuously. The                            Colonoscope was introduced through the anus and                            advanced to the 2 cm into the ileum. The                             colonoscopy was performed without difficulty. The                            patient tolerated the procedure well. The quality                            of the bowel preparation was good. The terminal                            ileum, ileocecal valve, appendiceal orifice, and                            rectum were photographed. Scope In: 8:40:44 AM Scope Out: 8:54:00 AM Scope Withdrawal Time: 0 hours 7 minutes 53 seconds  Total Procedure Duration: 0 hours 13 minutes 16 seconds  Findings:                 A 8 mm polyp was found in the proximal ascending                            colon. The polyp was sessile. The polyp was removed                            with a cold snare. Resection and retrieval were                            complete.                           A 2 mm polyp was found in the mid ascending colon.                            The polyp was sessile. The polyp was removed with a                            cold biopsy  forceps. Resection and retrieval were                            complete.                           A few small-mouthed diverticula were found in the                            sigmoid colon and ascending colon.                           Non-bleeding internal hemorrhoids were found during                            retroflexion. The hemorrhoids were small.                           The terminal ileum appeared normal.                           The exam was otherwise without abnormality on                            direct and retroflexion views. Complications:            No immediate complications. Estimated Blood Loss:     Estimated blood loss: none. Impression:               -Colonic polyps s/p polypectomy.                           -Mild colonic diverticulosis.                           -Otherwise normal colonoscopy to TI. Recommendation:           - Patient has a contact number available for                             emergencies. The signs and symptoms of potential                            delayed complications were discussed with the                            patient. Return to normal activities tomorrow.                            Written discharge instructions were provided to the                            patient.                           - Resume previous diet.                           -  Continue present medications.                           - Await pathology results.                           - Repeat colonoscopy for surveillance based on                            pathology results.                           - Return to GI clinic PRN.                           - D/W Jori Moll. Jackquline Denmark, MD 10/25/2019 9:00:56 AM This report has been signed electronically.

## 2019-10-25 NOTE — Progress Notes (Signed)
Pt's states no medical or surgical changes since previsit or office visit.  Temp-jb  V/s-sb

## 2019-10-25 NOTE — Patient Instructions (Signed)
2 POLYPS WERE REMOVED TODAY. MILD DIVERTICULOSIS.     YOU HAD AN ENDOSCOPIC PROCEDURE TODAY AT [facility]:   Refer to the procedure report that was given to you for any specific questions about what was found during the examination.  If the procedure report does not answer your questions, please call your gastroenterologist to clarify.  If you requested that your care partner not be given the details of your procedure findings, then the procedure report has been included in a sealed envelope for you to review at your convenience later.  YOU SHOULD EXPECT: Some feelings of bloating in the abdomen. Passage of more gas than usual.  Walking can help get rid of the air that was put into your GI tract during the procedure and reduce the bloating. If you had a lower endoscopy (such as a colonoscopy or flexible sigmoidoscopy) you may notice spotting of blood in your stool or on the toilet paper. If you underwent a bowel prep for your procedure, you may not have a normal bowel movement for a few days.  Please Note:  You might notice some irritation and congestion in your nose or some drainage.  This is from the oxygen used during your procedure.  There is no need for concern and it should clear up in a day or so.  SYMPTOMS TO REPORT IMMEDIATELY:   Following lower endoscopy (colonoscopy or flexible sigmoidoscopy):  Excessive amounts of blood in the stool  Significant tenderness or worsening of abdominal pains  Swelling of the abdomen that is new, acute  Fever of 100F or higher   For urgent or emergent issues, a gastroenterologist can be reached at any hour by calling [phone].   DIET:  We do recommend a small meal at first, but then you may proceed to your regular diet.  Drink plenty of fluids but you should avoid alcoholic beverages for 24 hours.  ACTIVITY:  You should plan to take it easy for the rest of today and you should NOT DRIVE or use heavy machinery until tomorrow  (because of the sedation medicines used during the test).    FOLLOW UP: Our staff will call the number listed on your records 48-72 hours following your procedure to check on you and address any questions or concerns that you may have regarding the information given to you following your procedure. If we do not reach you, we will leave a message.  We will attempt to reach you two times.  During this call, we will ask if you have developed any symptoms of COVID 19. If you develop any symptoms (ie: fever, flu-like symptoms, shortness of breath, cough etc.) before then, please call [phone].  If you test positive for Covid 19 in the 2 weeks post procedure, please call and report this information to [place].    If any biopsies were taken you will be contacted by phone or by letter within the next 1-3 weeks.  Please call us at [phone] if you have not heard about the biopsies in 3 weeks.    SIGNATURES/CONFIDENTIALITY: You and/or your care partner have signed paperwork which will be entered into your electronic medical record.  These signatures attest to the fact that that the information above on your After Visit Summary has been reviewed and is understood.  Full responsibility of the confidentiality of this discharge information lies with you and/or your care-partner.

## 2019-10-29 ENCOUNTER — Telehealth: Payer: Self-pay | Admitting: *Deleted

## 2019-10-29 NOTE — Telephone Encounter (Signed)
  Follow up Call-  Call back number 10/25/2019  Post procedure Call Back phone  # [phone]  Permission to leave phone message Yes  Some recent data might be hidden     Patient questions:  Do you have a fever, pain , or abdominal swelling? No. Pain Score  0 *  Have you tolerated food without any problems? Yes.    Have you been able to return to your normal activities? Yes.    Do you have any questions about your discharge instructions: Diet   No. Medications  No. Follow up visit  No.  Do you have questions or concerns about your Care? No.  Actions: * If pain score is 4 or above: No action needed, pain <4. 1. Have you developed a fever since your procedure? no  2.   Have you had an respiratory symptoms (SOB or cough) since your procedure? no  3.   Have you tested positive for COVID 19 since your procedure no  4.   Have you had any family members/close contacts diagnosed with the COVID 19 since your procedure?  no   If yes to any of these questions please route to Joylene John, RN and Alphonsa Gin, Therapist, sports.

## 2019-10-31 ENCOUNTER — Encounter: Payer: Self-pay | Admitting: Gastroenterology

## 2019-11-20 ENCOUNTER — Other Ambulatory Visit: Payer: Self-pay | Admitting: Family Medicine

## 2019-12-04 ENCOUNTER — Other Ambulatory Visit: Payer: Self-pay

## 2019-12-04 ENCOUNTER — Ambulatory Visit (HOSPITAL_BASED_OUTPATIENT_CLINIC_OR_DEPARTMENT_OTHER)
Admission: RE | Admit: 2019-12-04 | Discharge: 2019-12-04 | Disposition: A | Discharge status: Home / Self Care | Payer: Medicare Other | Source: Ambulatory Visit | Attending: Family Medicine | Admitting: Family Medicine

## 2019-12-04 DIAGNOSIS — Z1231 Encounter for screening mammogram for malignant neoplasm of breast: Secondary | ICD-10-CM | POA: Insufficient documentation

## 2020-01-09 ENCOUNTER — Ambulatory Visit: Payer: Medicare Other

## 2020-01-16 ENCOUNTER — Ambulatory Visit: Payer: Medicare Other | Attending: Internal Medicine

## 2020-01-16 DIAGNOSIS — Z23 Encounter for immunization: Secondary | ICD-10-CM | POA: Insufficient documentation

## 2020-01-16 NOTE — Progress Notes (Signed)
   Covid-19 Vaccination Clinic  Name:  Nicole Long    MRN: [id number] DOB: 1951/09/30  01/16/2020  [name] was observed post Covid-19 immunization for 15 minutes without incidence. She was provided with Vaccine Information Sheet and instruction to access the V-Safe system.   [name] was instructed to call 911 with any severe reactions post vaccine: [name] Difficulty breathing  . Swelling of your face and throat  . A fast heartbeat  . A bad rash all over your body  . Dizziness and weakness    Immunizations Administered    Name Date Dose VIS Date Route   Pfizer COVID-19 Vaccine 01/16/2020 10:06 AM 0.3 mL 11/22/2019 Intramuscular   Manufacturer: [place]   Lot: [id number]   [place]: [id number]

## 2020-02-10 ENCOUNTER — Ambulatory Visit: Payer: Medicare Other | Attending: Internal Medicine

## 2020-02-10 DIAGNOSIS — Z23 Encounter for immunization: Secondary | ICD-10-CM | POA: Insufficient documentation

## 2020-02-10 NOTE — Progress Notes (Signed)
   Covid-19 Vaccination Clinic  Name:  Nicole Long    MRN: [id number] DOB: May 12, 1951  02/10/2020  Nicole Long was observed post Covid-19 immunization for 15 minutes without incidence. She was provided with Vaccine Information Sheet and instruction to access the V-Safe system.   Nicole Long was instructed to call 911 with any severe reactions post vaccine: [name] Difficulty breathing  . Swelling of your face and throat  . A fast heartbeat  . A bad rash all over your body  . Dizziness and weakness    Immunizations Administered    Name Date Dose VIS Date Route   Pfizer COVID-19 Vaccine 02/10/2020  2:24 PM 0.3 mL 11/22/2019 Intramuscular   Manufacturer: [place]   Lot: [id number]   [place]: [id number]

## 2020-03-11 ENCOUNTER — Other Ambulatory Visit: Payer: Self-pay

## 2020-03-11 ENCOUNTER — Ambulatory Visit (INDEPENDENT_AMBULATORY_CARE_PROVIDER_SITE_OTHER): Payer: Medicare Other | Admitting: Family Medicine

## 2020-03-11 ENCOUNTER — Encounter: Payer: Self-pay | Admitting: Family Medicine

## 2020-03-11 VITALS — BP 130/78 | HR 103 | Temp 97.2°F | Ht 65.5 in | Wt 162.0 lb

## 2020-03-11 DIAGNOSIS — I10 Essential (primary) hypertension: Secondary | ICD-10-CM

## 2020-03-11 DIAGNOSIS — R7303 Prediabetes: Secondary | ICD-10-CM | POA: Diagnosis not present

## 2020-03-11 DIAGNOSIS — M25511 Pain in right shoulder: Secondary | ICD-10-CM | POA: Diagnosis not present

## 2020-03-11 LAB — CBC WITH DIFFERENTIAL/PLATELET
Basophils Absolute: 0 10*3/uL (ref 0.0–0.1)
Basophils Relative: 0.4 % (ref 0.0–3.0)
Eosinophils Absolute: 0 10*3/uL (ref 0.0–0.7)
Eosinophils Relative: 0.7 % (ref 0.0–5.0)
HCT: 38.3 % (ref 36.0–46.0)
Hemoglobin: 12.9 g/dL (ref 12.0–15.0)
Lymphocytes Relative: 25.9 % (ref 12.0–46.0)
Lymphs Abs: 1.3 10*3/uL (ref 0.7–4.0)
MCHC: 33.7 g/dL (ref 30.0–36.0)
MCV: 93.6 fl (ref 78.0–100.0)
Monocytes Absolute: 0.4 10*3/uL (ref 0.1–1.0)
Monocytes Relative: 7.8 % (ref 3.0–12.0)
Neutro Abs: 3.3 10*3/uL (ref 1.4–7.7)
Neutrophils Relative %: 65.2 % (ref 43.0–77.0)
Platelets: 231 10*3/uL (ref 150.0–400.0)
RBC: 4.1 Mil/uL (ref 3.87–5.11)
RDW: 13.5 % (ref 11.5–15.5)
WBC: 5.1 10*3/uL (ref 4.0–10.5)

## 2020-03-11 LAB — LIPID PANEL
Cholesterol: 217 mg/dL — ABNORMAL HIGH (ref 0–200)
HDL: 73.2 mg/dL (ref >39.00)
LDL Cholesterol: 132 mg/dL — ABNORMAL HIGH (ref 0–99)
NonHDL: 143.65
Total CHOL/HDL Ratio: 3
Triglycerides: 59 mg/dL (ref 0.0–149.0)
VLDL: 11.8 mg/dL (ref 0.0–40.0)

## 2020-03-11 LAB — COMPREHENSIVE METABOLIC PANEL
ALT: 16 U/L (ref 0–35)
AST: 20 U/L (ref 0–37)
Albumin: 4.6 g/dL (ref 3.5–5.2)
Alkaline Phosphatase: 80 U/L (ref 39–117)
BUN: 11 mg/dL (ref 6–23)
CO2: 27 mEq/L (ref 19–32)
Calcium: 9.8 mg/dL (ref 8.4–10.5)
Chloride: 104 mEq/L (ref 96–112)
Creatinine, Ser: 0.68 mg/dL (ref 0.40–1.20)
GFR: 103.8 mL/min (ref >60.00)
Glucose, Bld: 95 mg/dL (ref 70–99)
Potassium: 3.8 mEq/L (ref 3.5–5.1)
Sodium: 139 mEq/L (ref 135–145)
Total Bilirubin: 0.9 mg/dL (ref 0.2–1.2)
Total Protein: 7.6 g/dL (ref 6.0–8.3)

## 2020-03-11 LAB — HEMOGLOBIN A1C: Hgb A1c MFr Bld: 5.8 % (ref 4.6–6.5)

## 2020-03-11 MED ORDER — VALACYCLOVIR HCL 1 G PO TABS: ORAL_TABLET | ORAL | 2 refills | Status: DC

## 2020-03-11 MED ORDER — HYDROCORTISONE 2.5 % EX CREA: TOPICAL_CREAM | Freq: Two times a day (BID) | CUTANEOUS | 0 refills | Status: DC
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
## 2020-03-11 NOTE — Patient Instructions (Addendum)
1) for shoulder/tricep pain. Referral placed to PT. Get over the counter voltaren gel and rub on sore area up to four times a day. If pain continue we will need to image you... one of your tests was positive for rotator cuff injury, but I do think you strained a muscle/tendon.   2) blood pressure is excellent. Checking all of your labs today.   Happy easter! Dr. Rogers Blocker

## 2020-03-17 DIAGNOSIS — M7521 Bicipital tendinitis, right shoulder: Secondary | ICD-10-CM | POA: Diagnosis not present

## 2020-03-19 DIAGNOSIS — M7521 Bicipital tendinitis, right shoulder: Secondary | ICD-10-CM | POA: Diagnosis not present

## 2020-03-23 DIAGNOSIS — M7521 Bicipital tendinitis, right shoulder: Secondary | ICD-10-CM | POA: Diagnosis not present

## 2020-03-27 DIAGNOSIS — M7521 Bicipital tendinitis, right shoulder: Secondary | ICD-10-CM | POA: Diagnosis not present

## 2020-04-01 DIAGNOSIS — M7521 Bicipital tendinitis, right shoulder: Secondary | ICD-10-CM | POA: Diagnosis not present

## 2020-04-02 DIAGNOSIS — M7521 Bicipital tendinitis, right shoulder: Secondary | ICD-10-CM | POA: Diagnosis not present

## 2020-04-06 DIAGNOSIS — M7521 Bicipital tendinitis, right shoulder: Secondary | ICD-10-CM | POA: Diagnosis not present

## 2020-04-09 DIAGNOSIS — M7521 Bicipital tendinitis, right shoulder: Secondary | ICD-10-CM | POA: Diagnosis not present

## 2020-04-16 DIAGNOSIS — M7521 Bicipital tendinitis, right shoulder: Secondary | ICD-10-CM | POA: Diagnosis not present

## 2020-04-20 DIAGNOSIS — M7521 Bicipital tendinitis, right shoulder: Secondary | ICD-10-CM | POA: Diagnosis not present

## 2020-04-23 DIAGNOSIS — M7521 Bicipital tendinitis, right shoulder: Secondary | ICD-10-CM | POA: Diagnosis not present

## 2020-04-29 DIAGNOSIS — M7521 Bicipital tendinitis, right shoulder: Secondary | ICD-10-CM | POA: Diagnosis not present

## 2020-05-01 DIAGNOSIS — M7521 Bicipital tendinitis, right shoulder: Secondary | ICD-10-CM | POA: Diagnosis not present

## 2020-05-05 DIAGNOSIS — M7521 Bicipital tendinitis, right shoulder: Secondary | ICD-10-CM | POA: Diagnosis not present

## 2020-05-07 DIAGNOSIS — M7521 Bicipital tendinitis, right shoulder: Secondary | ICD-10-CM | POA: Diagnosis not present

## 2020-05-13 DIAGNOSIS — M7521 Bicipital tendinitis, right shoulder: Secondary | ICD-10-CM | POA: Diagnosis not present

## 2020-05-19 DIAGNOSIS — M7521 Bicipital tendinitis, right shoulder: Secondary | ICD-10-CM | POA: Diagnosis not present

## 2020-05-21 DIAGNOSIS — M7521 Bicipital tendinitis, right shoulder: Secondary | ICD-10-CM | POA: Diagnosis not present

## 2020-05-26 DIAGNOSIS — M7521 Bicipital tendinitis, right shoulder: Secondary | ICD-10-CM | POA: Diagnosis not present

## 2020-06-23 DIAGNOSIS — M7521 Bicipital tendinitis, right shoulder: Secondary | ICD-10-CM | POA: Diagnosis not present

## 2020-06-25 DIAGNOSIS — M7521 Bicipital tendinitis, right shoulder: Secondary | ICD-10-CM | POA: Diagnosis not present

## 2020-07-02 DIAGNOSIS — M7521 Bicipital tendinitis, right shoulder: Secondary | ICD-10-CM | POA: Diagnosis not present

## 2020-09-09 ENCOUNTER — Telehealth: Payer: Self-pay

## 2020-09-09 NOTE — Telephone Encounter (Signed)
Patient is wanting to know if she needs ot fast for her upcoming appointment

## 2020-09-09 NOTE — Telephone Encounter (Signed)
Yes, if she can that would be great.  Thanks! Aw

## 2020-09-10 ENCOUNTER — Ambulatory Visit: Payer: Medicare Other | Admitting: Family Medicine

## 2020-09-10 NOTE — Telephone Encounter (Signed)
I spoke with the pt to give message below. Pt voiced understanding.  

## 2020-09-24 ENCOUNTER — Other Ambulatory Visit: Payer: Self-pay

## 2020-09-24 ENCOUNTER — Ambulatory Visit (INDEPENDENT_AMBULATORY_CARE_PROVIDER_SITE_OTHER): Payer: Medicare Other | Admitting: Family Medicine

## 2020-09-24 ENCOUNTER — Encounter: Payer: Self-pay | Admitting: Family Medicine

## 2020-09-24 VITALS — BP 137/83 | HR 99 | Temp 96.1°F | Ht 65.5 in | Wt 161.4 lb

## 2020-09-24 DIAGNOSIS — Z23 Encounter for immunization: Secondary | ICD-10-CM

## 2020-09-24 DIAGNOSIS — I10 Essential (primary) hypertension: Secondary | ICD-10-CM

## 2020-09-24 DIAGNOSIS — E559 Vitamin D deficiency, unspecified: Secondary | ICD-10-CM | POA: Diagnosis not present

## 2020-09-24 DIAGNOSIS — R7303 Prediabetes: Secondary | ICD-10-CM

## 2020-09-24 DIAGNOSIS — Z1231 Encounter for screening mammogram for malignant neoplasm of breast: Secondary | ICD-10-CM | POA: Diagnosis not present

## 2020-09-24 NOTE — Patient Instructions (Addendum)
-  you look so great! So proud of you. Keep up the good work.  -routine labs today -ordered a mammogram for you, they should call you with this. ([facility])  -would not get shingles shot until 2 months or more after covid booster  See you in 6 months!  Dr. Rogers Blocker

## 2020-09-24 NOTE — Progress Notes (Signed)
Patient: Nicole Long MRN: [id number] DOB: [birth year] PCP: Orma Flaming, MD     Subjective:  Chief Complaint  Patient presents with   Hypertension   Prediabetes   Hyperlipidemia    HPI: The patient is a 69 y.o. female who presents today for HTN, Prediabetes. No complaints today. She is requesting a flu shot today.  Hypertension: Here for follow up of hypertension.  Currently on norvasc 10mg /day . Home readings range from 324-401 [id number]/44-03 diastolic. Takes medication as prescribed and denies any side effects. Exercise includes yoga, weights, stretching. Weight has been stable. Denies any chest pain, headaches, shortness of breath, vision changes, swelling in lower extremities.   Prediabetes Her last a1c was very good at 5.8. she is very fit and healthy and I do not believe this to be out of goal.   Hyperlipidemia ascvd risk was 12.5%. did not want to start medication and work on lifestyle. Would like to check today. She is fasting.   Flu shot today. covid booster on Saturday is scheduled. Would like a mammogram.   -would like to know about shingles vaccine.   Review of Systems  Constitutional: Negative for chills, fatigue and fever.  HENT: Negative for congestion, dental problem, ear pain, hearing loss, sore throat and trouble swallowing.   Eyes: Negative for visual disturbance.  Respiratory: Negative for cough, chest tightness and shortness of breath.   Cardiovascular: Negative for chest pain, palpitations and leg swelling.  Gastrointestinal: Negative for abdominal pain, blood in stool, diarrhea and nausea.  Endocrine: Negative for cold intolerance, polydipsia, polyphagia and polyuria.  Genitourinary: Negative for dysuria and hematuria.  Musculoskeletal: Negative for arthralgias.  Skin: Negative for rash.  Neurological: Negative for dizziness, syncope and headaches.  Psychiatric/Behavioral: Negative for dysphoric mood and sleep disturbance. The patient is not  nervous/anxious.     Allergies Patient is allergic to hydrochlorothiazide.  Past Medical History Patient  has a past medical history of Abnormal EKG, Bicipital tendinitis of right shoulder (02/2017), Eczema, History of vitamin D deficiency, adenomatous colonic polyps, Hyperlipidemia (09/2016), IFG (impaired fasting glucose) (09/2016), Lipoma, Postmenopausal, and White coat syndrome without diagnosis of hypertension.  Surgical History Patient  has a past surgical history that includes Lipoma excision (1990); Ganglion cyst excision (1988); Tubal ligation; Bunionectomy (2010 and 2011); transthoracic echocardiogram (07/04/14); DEXA (09/27/2016); Colonoscopy (11/18/2010,12/24/2013); and Polypectomy.  Family History [name]'s family history includes Breast cancer in her sister and sister; Cancer in her brother; Congestive Heart Failure in her mother; Heart disease in her brother; Kidney disease in her brother and sister; Stroke in her sister and sister; Thyroid disease in her son.  Social History Patient  reports that she quit smoking about 35 years ago. Her smoking use included cigarettes. She has never used smokeless tobacco. She reports current alcohol use. She reports that she does not use drugs.    Objective: Vitals:   09/24/20 1045  BP: 137/83  Pulse: 99  Temp: (!) 96.1 F (35.6 C)  TempSrc: Temporal  SpO2: 100%  Weight: 161 lb 6.4 oz (73.2 kg)  Height: 5' 5.5" (1.664 m)    Body mass index is 26.45 kg/m.  Physical Exam Vitals reviewed.  Constitutional:      Appearance: Normal appearance. She is well-developed.  HENT:     Head: Normocephalic and atraumatic.     Right Ear: External ear normal.     Left Ear: External ear normal.  Eyes:     Conjunctiva/sclera: Conjunctivae normal.     Pupils: Pupils are equal, round, and  reactive to light.  Neck:     Thyroid: No thyromegaly.  Cardiovascular:     Rate and Rhythm: Normal rate and regular rhythm.     Heart sounds: Normal heart  sounds. No murmur heard.   Pulmonary:     Effort: Pulmonary effort is normal.     Breath sounds: Normal breath sounds.  Abdominal:     General: Bowel sounds are normal. There is no distension.     Palpations: Abdomen is soft.     Tenderness: There is no abdominal tenderness.  Musculoskeletal:     Cervical back: Normal range of motion and neck supple.  Lymphadenopathy:     Cervical: No cervical adenopathy.  Skin:    General: Skin is warm and dry.     Findings: No rash.  Neurological:     Mental Status: She is alert and oriented to person, place, and time.     Cranial Nerves: No cranial nerve deficit.     Coordination: Coordination normal.     Deep Tendon Reflexes: Reflexes normal.  Psychiatric:        Behavior: Behavior normal.           Office Visit from 09/24/2020 in [place]  PHQ-2 Total Score 0      Assessment/plan: 1. Need for immunization against influenza  - Flu Vaccine QUAD High Dose(Fluad)  2. HTN (hypertension), benign Blood pressure is to goal. Continue current anti-hypertensive medications: norvasc 10mg /day. Refills not given and routine lab work will be done today. Recommended routine exercise and healthy diet including DASH diet and mediterranean diet. Encouraged weight loss. F/u in 6 months.   - CBC with Differential/Platelet; Future - Lipid panel; Future - COMPLETE METABOLIC PANEL WITH GFR; Future  3. Vitamin D deficiency  - VITAMIN D 25 Hydroxy (Vit-D Deficiency, Fractures); Future  4. Prediabetes  - Hemoglobin A1c; Future  5. Encounter for screening mammogram for malignant neoplasm of breast  - MM Digital Screening; Future    -covid booster scheduled. Recommended waiting at least 2 months before shingles vaccine.   This visit occurred during the SARS-CoV-2 public health emergency.  Safety protocols were in place, including screening questions prior to the visit, additional usage of staff PPE, and extensive  cleaning of exam room while observing appropriate contact time as indicated for disinfecting solutions.     Return in about 6 months (around 03/25/2021) for htn/prediabetes.    Orma Flaming, MD [place]   09/24/2020

## 2020-09-25 ENCOUNTER — Telehealth: Payer: Self-pay

## 2020-09-25 LAB — COMPLETE METABOLIC PANEL WITH GFR
AG Ratio: 1.5 (calc) (ref 1.0–2.5)
ALT: 14 U/L (ref 6–29)
AST: 20 U/L (ref 10–35)
Albumin: 4.5 g/dL (ref 3.6–5.1)
Alkaline phosphatase (APISO): 75 U/L (ref 37–153)
BUN: 11 mg/dL (ref 7–25)
CO2: 27 mmol/L (ref 20–32)
Calcium: 9.8 mg/dL (ref 8.6–10.4)
Chloride: 106 mmol/L (ref 98–110)
Creat: 0.74 mg/dL (ref 0.50–0.99)
GFR, Est African American: 96 mL/min/{1.73_m2} (ref >=60)
GFR, Est Non African American: 83 mL/min/{1.73_m2} (ref >=60)
Globulin: 3 g/dL (calc) (ref 1.9–3.7)
Glucose, Bld: 93 mg/dL (ref 65–99)
Potassium: 4.3 mmol/L (ref 3.5–5.3)
Sodium: 140 mmol/L (ref 135–146)
Total Bilirubin: 1 mg/dL (ref 0.2–1.2)
Total Protein: 7.5 g/dL (ref 6.1–8.1)

## 2020-09-25 LAB — CBC WITH DIFFERENTIAL/PLATELET
Absolute Monocytes: 344 cells/uL (ref 200–950)
Basophils Absolute: 21 cells/uL (ref 0–200)
Basophils Relative: 0.5 %
Eosinophils Absolute: 42 cells/uL (ref 15–500)
Eosinophils Relative: 1 %
HCT: 37.5 % (ref 35.0–45.0)
Hemoglobin: 12.6 g/dL (ref 11.7–15.5)
Lymphs Abs: 1285 cells/uL (ref 850–3900)
MCH: 31.2 pg (ref 27.0–33.0)
MCHC: 33.6 g/dL (ref 32.0–36.0)
MCV: 92.8 fL (ref 80.0–100.0)
MPV: 11 fL (ref 7.5–12.5)
Monocytes Relative: 8.2 %
Neutro Abs: 2507 cells/uL (ref 1500–7800)
Neutrophils Relative %: 59.7 %
Platelets: 236 10*3/uL (ref 140–400)
RBC: 4.04 10*6/uL (ref 3.80–5.10)
RDW: 13 % (ref 11.0–15.0)
Total Lymphocyte: 30.6 %
WBC: 4.2 10*3/uL (ref 3.8–10.8)

## 2020-09-25 LAB — LIPID PANEL
Cholesterol: 204 mg/dL — ABNORMAL HIGH (ref <200)
HDL: 73 mg/dL (ref >=50)
LDL Cholesterol (Calc): 115 mg/dL (calc) — ABNORMAL HIGH
Non-HDL Cholesterol (Calc): 131 mg/dL (calc) — ABNORMAL HIGH (ref <130)
Total CHOL/HDL Ratio: 2.8 (calc) (ref <5.0)
Triglycerides: 67 mg/dL (ref <150)

## 2020-09-25 LAB — HEMOGLOBIN A1C
Hgb A1c MFr Bld: 5.6 % of total Hgb (ref <5.7)
Mean Plasma Glucose: 114 (calc)
eAG (mmol/L): 6.3 (calc)

## 2020-09-25 LAB — VITAMIN D 25 HYDROXY (VIT D DEFICIENCY, FRACTURES): Vit D, 25-Hydroxy: 72 ng/mL (ref 30–100)

## 2020-09-25 NOTE — Telephone Encounter (Signed)
Patient has called back in to receive lab work.    I have given patient Dr. Shelby Mattocks response.  Patient understands.  She would also like to thank Dr. Rogers Blocker for believing in her and being such an inspiration.

## 2020-09-26 ENCOUNTER — Ambulatory Visit: Payer: Medicare Other | Attending: Internal Medicine

## 2020-09-26 DIAGNOSIS — Z23 Encounter for immunization: Secondary | ICD-10-CM

## 2020-09-26 NOTE — Progress Notes (Signed)
   Covid-19 Vaccination Clinic  Name:  Nicole Long    MRN: [id number] DOB: [birth year]  09/26/2020  Nicole Long was observed post Covid-19 immunization for 15 minutes without incident. She was provided with Vaccine Information Sheet and instruction to access the V-Safe system.   Nicole Long was instructed to call 911 with any severe reactions post vaccine: [name] Difficulty breathing  . Swelling of face and throat  . A fast heartbeat  . A bad rash all over body  . Dizziness and weakness

## 2020-10-07 ENCOUNTER — Telehealth: Payer: Self-pay | Admitting: Family Medicine

## 2020-10-07 NOTE — Telephone Encounter (Signed)
Left message for patient to call back and schedule Medicare Annual Wellness Visit (AWV) either virtually/audio only OR in office. Whatever the patients preference is.  Last AWV 09/04/19; please schedule at anytime with [facility] Health Advisor at [facility].  This should be a 45 minute visit.

## 2020-10-12 ENCOUNTER — Ambulatory Visit (INDEPENDENT_AMBULATORY_CARE_PROVIDER_SITE_OTHER): Payer: Medicare Other

## 2020-10-12 DIAGNOSIS — Z0000 Encounter for general adult medical examination without abnormal findings: Secondary | ICD-10-CM

## 2020-10-12 NOTE — Patient Instructions (Addendum)
[name] , Thank you for taking time to come for your Medicare Wellness Visit. I appreciate your ongoing commitment to your health goals. Please review the following plan we discussed and let me know if I can assist you in the future.   Screening recommendations/referrals: Colonoscopy: Done 10/25/2019 Mammogram: Done 12/04/2019 Bone Density: Done 09/27/16 Recommended yearly ophthalmology/optometry visit for glaucoma screening and checkup Recommended yearly dental visit for hygiene and checkup  Vaccinations: Influenza vaccine: Up to date Pneumococcal vaccine: Up to date Tdap vaccine: Up to date Shingles vaccine: Shingrix, Please contact your pharmacy for coverage information.    Covid-19:Completed 2/4 & 3/1, 09/26/20  Advanced directives: Pt has information to review  Conditions/risks identified: Continue to work on exercise and diet  Next appointment: Follow up in one year for your annual wellness visit    Preventive Care 69 Years and Older, Female Preventive care refers to lifestyle choices and visits with your health care provider that can promote health and wellness. What does preventive care include?  A yearly physical exam. This is also called an annual well check.  Dental exams once or twice a year.  Routine eye exams. Ask your health care provider how often you should have your eyes checked.  Personal lifestyle choices, including:  Daily care of your teeth and gums.  Regular physical activity.  Eating a healthy diet.  Avoiding tobacco and drug use.  Limiting alcohol use.  Practicing safe sex.  Taking low-dose aspirin every day.  Taking vitamin and mineral supplements as recommended by your health care provider. What happens during an annual well check? The services and screenings done by your health care provider during your annual well check will depend on your age, overall health, lifestyle risk factors, and family history of disease. Counseling  Your  health care provider may ask you questions about your:  Alcohol use.  Tobacco use.  Drug use.  Emotional well-being.  Home and relationship well-being.  Sexual activity.  Eating habits.  History of falls.  Memory and ability to understand (cognition).  Work and work Statistician.  Reproductive health. Screening  You may have the following tests or measurements:  Height, weight, and BMI.  Blood pressure.  Lipid and cholesterol levels. These may be checked every 5 years, or more frequently if you are over 86 years old.  Skin check.  Lung cancer screening. You may have this screening every year starting at age 23 if you have a 30-pack-year history of smoking and currently smoke or have quit within the past 15 years.  Fecal occult blood test (FOBT) of the stool. You may have this test every year starting at age 9.  Flexible sigmoidoscopy or colonoscopy. You may have a sigmoidoscopy every 5 years or a colonoscopy every 10 years starting at age 4.  Hepatitis C blood test.  Hepatitis B blood test.  Sexually transmitted disease (STD) testing.  Diabetes screening. This is done by checking your blood sugar (glucose) after you have not eaten for a while (fasting). You may have this done every 1-3 years.  Bone density scan. This is done to screen for osteoporosis. You may have this done starting at age 35.  Mammogram. This may be done every 1-2 years. Talk to your health care provider about how often you should have regular mammograms. Talk with your health care provider about your test results, treatment options, and if necessary, the need for more tests. Vaccines  Your health care provider may recommend certain vaccines, such as:  Influenza vaccine. This is recommended every year.  Tetanus, diphtheria, and acellular pertussis (Tdap, Td) vaccine. You may need a Td booster every 10 years.  Zoster vaccine. You may need this after age 55.  Pneumococcal 13-valent  conjugate (PCV13) vaccine. One dose is recommended after age 73.  Pneumococcal polysaccharide (PPSV23) vaccine. One dose is recommended after age 11. Talk to your health care provider about which screenings and vaccines you need and how often you need them. This information is not intended to replace advice given to you by your health care provider. Make sure you discuss any questions you have with your health care provider. Document Released: 12/25/2015 Document Revised: 08/17/2016 Document Reviewed: 09/29/2015 Elsevier Interactive Patient Education  2017 [place] Prevention in the Home Falls can cause injuries. They can happen to people of all ages. There are many things you can do to make your home safe and to help prevent falls. What can I do on the outside of my home?  Regularly fix the edges of walkways and driveways and fix any cracks.  Remove anything that might make you trip as you walk through a door, such as a raised step or threshold.  Trim any bushes or trees on the path to your home.  Use bright outdoor lighting.  Clear any walking paths of anything that might make someone trip, such as rocks or tools.  Regularly check to see if handrails are loose or broken. Make sure that both sides of any steps have handrails.  Any raised decks and porches should have guardrails on the edges.  Have any leaves, snow, or ice cleared regularly.  Use sand or salt on walking paths during winter.  Clean up any spills in your garage right away. This includes oil or grease spills. What can I do in the bathroom?  Use night lights.  Install grab bars by the toilet and in the tub and shower. Do not use towel bars as grab bars.  Use non-skid mats or decals in the tub or shower.  If you need to sit down in the shower, use a plastic, non-slip stool.  Keep the floor dry. Clean up any water that spills on the floor as soon as it happens.  Remove soap buildup in the tub or  shower regularly.  Attach bath mats securely with double-sided non-slip rug tape.  Do not have throw rugs and other things on the floor that can make you trip. What can I do in the bedroom?  Use night lights.  Make sure that you have a light by your bed that is easy to reach.  Do not use any sheets or blankets that are too big for your bed. They should not hang down onto the floor.  Have a firm chair that has side arms. You can use this for support while you get dressed.  Do not have throw rugs and other things on the floor that can make you trip. What can I do in the kitchen?  Clean up any spills right away.  Avoid walking on wet floors.  Keep items that you use a lot in easy-to-reach places.  If you need to reach something above you, use a strong step stool that has a grab bar.  Keep electrical cords out of the way.  Do not use floor polish or wax that makes floors slippery. If you must use wax, use non-skid floor wax.  Do not have throw rugs and other things on the floor that  can make you trip. What can I do with my stairs?  Do not leave any items on the stairs.  Make sure that there are handrails on both sides of the stairs and use them. Fix handrails that are broken or loose. Make sure that handrails are as long as the stairways.  Check any carpeting to make sure that it is firmly attached to the stairs. Fix any carpet that is loose or worn.  Avoid having throw rugs at the top or bottom of the stairs. If you do have throw rugs, attach them to the floor with carpet tape.  Make sure that you have a light switch at the top of the stairs and the bottom of the stairs. If you do not have them, ask someone to add them for you. What else can I do to help prevent falls?  Wear shoes that:  Do not have high heels.  Have rubber bottoms.  Are comfortable and fit you well.  Are closed at the toe. Do not wear sandals.  If you use a stepladder:  Make sure that it is fully  opened. Do not climb a closed stepladder.  Make sure that both sides of the stepladder are locked into place.  Ask someone to hold it for you, if possible.  Clearly mark and make sure that you can see:  Any grab bars or handrails.  First and last steps.  Where the edge of each step is.  Use tools that help you move around (mobility aids) if they are needed. These include:  Canes.  Walkers.  Scooters.  Crutches.  Turn on the lights when you go into a dark area. Replace any light bulbs as soon as they burn out.  Set up your furniture so you have a clear path. Avoid moving your furniture around.  If any of your floors are uneven, fix them.  If there are any pets around you, be aware of where they are.  Review your medicines with your doctor. Some medicines can make you feel dizzy. This can increase your chance of falling. Ask your doctor what other things that you can do to help prevent falls. This information is not intended to replace advice given to you by your health care provider. Make sure you discuss any questions you have with your health care provider. Document Released: 09/24/2009 Document Revised: 05/05/2016 Document Reviewed: 01/02/2015 Elsevier Interactive Patient Education  2017 [employer].

## 2020-10-12 NOTE — Progress Notes (Signed)
Virtual Visit via Telephone Note  I connected with  Nicole Long on 10/12/20 at 11:45 AM EDT by telephone and verified that I am speaking with the correct person using two identifiers.  Medicare Annual Wellness visit completed telephonically due to Covid-19 pandemic.   Persons participating in this call: This Health Coach and this patient.   Location: Patient: Home Provider: Office   I discussed the limitations, risks, security and privacy concerns of performing an evaluation and management service by telephone and the availability of in person appointments. The patient expressed understanding and agreed to proceed.  Unable to perform video visit due to video visit attempted and failed and/or patient does not have video capability.   Some vital signs may be absent or patient reported.   Willette Brace, LPN    Subjective:   Nicole Long is a 69 y.o. female who presents for Medicare Annual (Subsequent) preventive examination.  Review of Systems     Cardiac Risk Factors include: advanced age (>25men, >35 women);hypertension     Objective:    There were no vitals filed for this visit. There is no height or weight on file to calculate BMI.  Advanced Directives 10/12/2020 09/04/2019 11/28/2017  Does Patient Have a Medical Advance Directive? Yes No No  Does patient want to make changes to medical advance directive? Yes (MAU/Ambulatory/Procedural Areas - Information given) - -  Would patient like information on creating a medical advance directive? - Yes (MAU/Ambulatory/Procedural Areas - Information given) Yes (MAU/Ambulatory/Procedural Areas - Information given)    Current Medications (verified) Outpatient Encounter Medications as of 10/12/2020  Medication Sig  . amLODipine (NORVASC) 10 MG tablet TAKE 1 TABLET BY MOUTH EVERY DAY  . Ascorbic Acid (VITAMIN C PO) Take 1 tablet by mouth daily.  . Cholecalciferol (VITAMIN D3) 2000 units TABS Take 1 tablet by mouth daily.  .  hydrocortisone 2.5 % cream Apply topically 2 (two) times daily.  . Multiple Vitamin (MULTIVITAMIN) tablet Take 2 tablets by mouth daily.   . valACYclovir (VALTREX) 1000 MG tablet 2 tabs po q12h x 2 doses as needed for fever blisters/cold sores  . Zinc 30 MG TABS Take by mouth 3 (three) times a week.   No facility-administered encounter medications on file as of 10/12/2020.    Allergies (verified) Hydrochlorothiazide   History: Past Medical History:  Diagnosis Date  . Abnormal EKG    Inverted T waves in precordial leads: unchanged since 2006--this is her baseline  . Bicipital tendinitis of right shoulder 02/2017   PT  . Eczema   . History of vitamin D deficiency    Normal range with high dose replacement x 12 wks Jan 2019.  [name] Hx of adenomatous colonic polyps   . Hyperlipidemia 09/2016  . IFG (impaired fasting glucose) 09/2016   HbA1c 5.9%  . Lipoma   . Postmenopausal   . White coat syndrome without diagnosis of hypertension    Past Surgical History:  Procedure Laterality Date  . BUNIONECTOMY  2010 and 2011  . COLONOSCOPY  11/18/2010,12/24/2013   Hx of adenomatous colon polyps.  Several colonoscopies in [place].  Most recent 12/24/13: no polyps.  Recall 5 yrs (Dr. Shana Chute, [facility]).  [name] DEXA  09/27/2016   NORMAL.  Repeat 2 yrs.  [name] GANGLION CYST EXCISION  1988  . LIPOMA EXCISION  1990  . POLYPECTOMY    . TRANSTHORACIC ECHOCARDIOGRAM  07/04/14   Normal LV size and wall thickness, EF normal, grade I diast dysfxn  . TUBAL LIGATION  Family History  Problem Relation Age of Onset  . Congestive Heart Failure Mother   . Stroke Sister        passed away May 28, 2014  . Breast cancer Sister   . Cancer Brother   . Thyroid disease Son   . Breast cancer Sister   . Stroke Sister   . Kidney disease Sister   . Kidney disease Brother   . Heart disease Brother        pacemaker  . Colon cancer Neg Hx   . Esophageal cancer Neg Hx   . Rectal cancer Neg Hx   . Stomach cancer Neg  Hx   . Colon polyps Neg Hx    Social History   Socioeconomic History  . Marital status: Married    Spouse name: Not on file  . Number of children: Not on file  . Years of education: Not on file  . Highest education level: Not on file  Occupational History  . Occupation: Retired  Tobacco Use  . Smoking status: Former Smoker    Types: Cigarettes    Quit date: 09/11/1985    Years since quitting: 35.1  . Smokeless tobacco: Never Used  Vaping Use  . Vaping Use: Never used  Substance and Sexual Activity  . Alcohol use: Yes    Comment: socially--occ wine  . Drug use: No  . Sexual activity: Not on file  Other Topics Concern  . Not on file  Social History Narrative   Relocated to [facility] 2010 (has PMD in [place], [place]).   Married, has 49 y/o son and one step grandchild.   Retired Social worker.   Occ alc, no tob/drugs.   Social Determinants of Health   Financial Resource Strain: Low Risk   . Difficulty of Paying Living Expenses: Not hard at all  Food Insecurity: No Food Insecurity  . Worried About Charity fundraiser in the Last Year: Never true  . Ran Out of Food in the Last Year: Never true  Transportation Needs: No Transportation Needs  . Lack of Transportation (Medical): No  . Lack of Transportation (Non-Medical): No  Physical Activity: Sufficiently Active  . Days of Exercise per Week: 5 days  . Minutes of Exercise per Session: 40 min  Stress: No Stress Concern Present  . Feeling of Stress : Not at all  Social Connections: Moderately Isolated  . Frequency of Communication with Friends and Family: Three times a week  . Frequency of Social Gatherings with Friends and Family: Three times a week  . Attends Religious Services: Never  . Active Member of Clubs or Organizations: No  . Attends Archivist Meetings: Never  . Marital Status: Married    Tobacco Counseling Counseling given: Not Answered   Clinical Intake:  Pre-visit preparation  completed: Yes        BMI - recorded: 26.45 Nutritional Status: BMI 25 -29 Overweight Nutritional Risks: None Diabetes: No  How often do you need to have someone help you when you read instructions, pamphlets, or other written materials from your doctor or pharmacy?: 1 - Never  Diabetic?No  Interpreter Needed?: No  Information entered by :: Charlott Rakes, LPN   Activities of Daily Living In your present state of health, do you have any difficulty performing the following activities: 10/12/2020 09/24/2020  Hearing? N N  Vision? N N  Difficulty concentrating or making decisions? N N  Walking or climbing stairs? N N  Dressing or bathing? N N  Doing  errands, shopping? N N  Preparing Food and eating ? N -  Using the Toilet? N -  In the past six months, have you accidently leaked urine? N -  Do you have problems with loss of bowel control? N -  Managing your Medications? N -  Managing your Finances? N -  Housekeeping or managing your Housekeeping? N -  Some recent data might be hidden    Patient Care Team: Orma Flaming, MD as PCP - General (Family Medicine) Fay Records, MD as Consulting Physician (Cardiology) Hale Bogus., MD as Consulting Physician (Gastroenterology) Jackquline Denmark, MD as Consulting Physician (Gastroenterology)  Indicate any recent Medical Services you may have received from other than [facility] providers in the past year (date may be approximate).     Assessment:   This is a routine wellness examination for Nicole Long.  Hearing/Vision screen  Hearing Screening   125Hz  250Hz  500Hz  1000Hz  2000Hz  3000Hz  4000Hz  6000Hz  8000Hz   Right ear:           Left ear:           Comments: Denies any difficulty hearing at this time  Vision Screening Comments: Pt hasn't had eye exam in 2 years will follow up with Dr Rogers Blocker  Dietary issues and exercise activities discussed: Current Exercise Habits: Home exercise routine, Type of exercise: walking, Time (Minutes):  45, Frequency (Times/Week): 5, Weekly Exercise (Minutes/Week): 225  Goals    . Increase physical activity     Increase activity and continue healthy eating.     . Patient Stated     Continue to work on exercise and diet      Depression Screen PHQ 2/9 Scores 10/12/2020 09/24/2020 09/04/2019 11/28/2017 09/13/2016 09/13/2016  PHQ - 2 Score 0 0 0 0 0 0    Fall Risk Fall Risk  10/12/2020 09/24/2020 09/04/2019 11/28/2017 09/13/2016  Falls in the past year? 0 0 0 No No  Number falls in past yr: 0 - 0 - -  Injury with Fall? 0 - 0 - -  Risk for fall due to : - No Fall Risks - - -  Follow up Falls prevention discussed - Falls evaluation completed;Education provided;Falls prevention discussed - -    Any stairs in or around the home? Yes  If so, are there any without handrails? No  Home free of loose throw rugs in walkways, pet beds, electrical cords, etc? Yes  Adequate lighting in your home to reduce risk of falls? Yes   ASSISTIVE DEVICES UTILIZED TO PREVENT FALLS:  Life alert? No  Use of a cane, walker or w/c? No    TIMED UP AND GO:  Was the test performed? No .    Cognitive Function:     6CIT Screen 09/04/2019  What Year? 0 points  What month? 0 points  What time? 0 points  Count back from 20 0 points  Months in reverse 0 points  Repeat phrase 0 points  Total Score 0    Immunizations Immunization History  Administered Date(s) Administered  . Fluad Quad(high Dose 65+) 09/04/2019, 09/24/2020  . Influenza Split 09/10/2008, 10/06/2009  . Influenza, High Dose Seasonal PF 09/13/2016, 10/04/2017, 10/30/2018  . Influenza, Seasonal, Injecte, Preservative Fre 09/24/2014  . Influenza,inj,Quad PF,6+ Mos 09/11/2013  . Influenza,inj,quad, With Preservative 12/09/2015  . PFIZER SARS-COV-2 Vaccination 01/16/2020, 02/10/2020, 09/26/2020  . Pneumococcal Conjugate-13 04/19/2018  . Pneumococcal Polysaccharide-23 09/04/2019  . Tdap 09/11/2013  . Varicella 03/06/2014    TDAP status: Up  to date Flu  Vaccine status: Up to date Pneumococcal vaccine status: Up to date Covid-19 vaccine status: Completed vaccines  Qualifies for Shingles Vaccine? Yes   Zostavax completed No   Shingrix Completed?: No.    Education has been provided regarding the importance of this vaccine. Patient has been advised to call insurance company to determine out of pocket expense if they have not yet received this vaccine. Advised may also receive vaccine at local pharmacy or Health Dept. Verbalized acceptance and understanding.  Screening Tests Health Maintenance  Topic Date Due  . MAMMOGRAM  12/03/2021  . TETANUS/TDAP  09/12/2023  . COLONOSCOPY  10/24/2026  . INFLUENZA VACCINE  Completed  . DEXA SCAN  Completed  . COVID-19 Vaccine  Completed  . Hepatitis C Screening  Completed  . PNA vac Low Risk Adult  Completed    Health Maintenance  There are no preventive care reminders to display for this patient.  Colorectal cancer screening: Completed 10/25/19. Repeat every 7 years Mammogram status: Completed 12/04/19. Repeat every year Bone Density status: Completed 09/27/2016. Results reflect: Bone density results: NORMAL. Repeat every 2 years.    Additional Screening:  Hepatitis C Screening: Completed 11/22/18  Vision Screening: Recommended annual ophthalmology exams for early detection of glaucoma and other disorders of the eye. Is the patient up to date with their annual eye exam?  No  Who is the provider or what is the name of the office in which the patient attends annual eye exams? No Provider at this time If pt is not established with a provider, would they like to be referred to a provider to establish care? Yes .   Dental Screening: Recommended annual dental exams for proper oral hygiene  Community Resource Referral / Chronic Care Management: CRR required this visit?  No   CCM required this visit?  No      Plan:     I have personally reviewed and noted the following in the  patient's chart:   . Medical and social history . Use of alcohol, tobacco or illicit drugs  . Current medications and supplements . Functional ability and status . Nutritional status . Physical activity . Advanced directives . List of other physicians . Hospitalizations, surgeries, and ER visits in previous 12 months . Vitals . Screenings to include cognitive, depression, and falls . Referrals and appointments  In addition, I have reviewed and discussed with patient certain preventive protocols, quality metrics, and best practice recommendations. A written personalized care plan for preventive services as well as general preventive health recommendations were provided to patient.     Willette Brace, LPN   22/05/3334   Nurse Notes: None

## 2020-11-11 ENCOUNTER — Other Ambulatory Visit: Payer: Self-pay | Admitting: Family Medicine

## 2020-12-29 ENCOUNTER — Inpatient Hospital Stay (HOSPITAL_BASED_OUTPATIENT_CLINIC_OR_DEPARTMENT_OTHER): Admission: RE | Admit: 2020-12-29 | Payer: Medicare Other | Source: Ambulatory Visit

## 2021-01-26 ENCOUNTER — Ambulatory Visit (HOSPITAL_BASED_OUTPATIENT_CLINIC_OR_DEPARTMENT_OTHER)
Admission: RE | Admit: 2021-01-26 | Discharge: 2021-01-26 | Disposition: A | Discharge status: Home / Self Care | Payer: Medicare Other | Source: Ambulatory Visit | Attending: Family Medicine | Admitting: Family Medicine

## 2021-01-26 ENCOUNTER — Other Ambulatory Visit: Payer: Self-pay

## 2021-01-26 DIAGNOSIS — Z1231 Encounter for screening mammogram for malignant neoplasm of breast: Secondary | ICD-10-CM | POA: Diagnosis not present

## 2021-03-25 ENCOUNTER — Ambulatory Visit: Payer: Medicare Other | Admitting: Family Medicine

## 2021-03-31 ENCOUNTER — Encounter: Payer: Self-pay | Admitting: Family Medicine

## 2021-03-31 ENCOUNTER — Other Ambulatory Visit: Payer: Self-pay

## 2021-03-31 ENCOUNTER — Ambulatory Visit (INDEPENDENT_AMBULATORY_CARE_PROVIDER_SITE_OTHER): Payer: Medicare Other | Admitting: Family Medicine

## 2021-03-31 VITALS — BP 140/80 | HR 88 | Temp 98.0°F | Ht 65.5 in | Wt 163.6 lb

## 2021-03-31 DIAGNOSIS — M25511 Pain in right shoulder: Secondary | ICD-10-CM | POA: Diagnosis not present

## 2021-03-31 DIAGNOSIS — G8929 Other chronic pain: Secondary | ICD-10-CM

## 2021-03-31 DIAGNOSIS — I10 Essential (primary) hypertension: Secondary | ICD-10-CM | POA: Diagnosis not present

## 2021-03-31 DIAGNOSIS — R7303 Prediabetes: Secondary | ICD-10-CM | POA: Diagnosis not present

## 2021-03-31 LAB — COMPREHENSIVE METABOLIC PANEL
ALT: 16 U/L (ref 0–35)
AST: 23 U/L (ref 0–37)
Albumin: 4.5 g/dL (ref 3.5–5.2)
Alkaline Phosphatase: 72 U/L (ref 39–117)
BUN: 11 mg/dL (ref 6–23)
CO2: 25 mEq/L (ref 19–32)
Calcium: 10.1 mg/dL (ref 8.4–10.5)
Chloride: 104 mEq/L (ref 96–112)
Creatinine, Ser: 0.67 mg/dL (ref 0.40–1.20)
GFR: 88.77 mL/min (ref >60.00)
Glucose, Bld: 98 mg/dL (ref 70–99)
Potassium: 3.8 mEq/L (ref 3.5–5.1)
Sodium: 139 mEq/L (ref 135–145)
Total Bilirubin: 1 mg/dL (ref 0.2–1.2)
Total Protein: 8.1 g/dL (ref 6.0–8.3)

## 2021-03-31 LAB — CBC WITH DIFFERENTIAL/PLATELET
Basophils Absolute: 0 10*3/uL (ref 0.0–0.1)
Basophils Relative: 0.6 % (ref 0.0–3.0)
Eosinophils Absolute: 0 10*3/uL (ref 0.0–0.7)
Eosinophils Relative: 0.6 % (ref 0.0–5.0)
HCT: 39.1 % (ref 36.0–46.0)
Hemoglobin: 13.1 g/dL (ref 12.0–15.0)
Lymphocytes Relative: 24.8 % (ref 12.0–46.0)
Lymphs Abs: 1.2 10*3/uL (ref 0.7–4.0)
MCHC: 33.6 g/dL (ref 30.0–36.0)
MCV: 92.2 fl (ref 78.0–100.0)
Monocytes Absolute: 0.4 10*3/uL (ref 0.1–1.0)
Monocytes Relative: 7.8 % (ref 3.0–12.0)
Neutro Abs: 3.2 10*3/uL (ref 1.4–7.7)
Neutrophils Relative %: 66.2 % (ref 43.0–77.0)
Platelets: 249 10*3/uL (ref 150.0–400.0)
RBC: 4.24 Mil/uL (ref 3.87–5.11)
RDW: 13.8 % (ref 11.5–15.5)
WBC: 4.8 10*3/uL (ref 4.0–10.5)

## 2021-03-31 LAB — MICROALBUMIN / CREATININE URINE RATIO
Creatinine,U: 74.4 mg/dL
Microalb Creat Ratio: 1.3 mg/g (ref 0.0–30.0)
Microalb, Ur: 1 mg/dL (ref 0.0–1.9)

## 2021-03-31 MED ORDER — VALACYCLOVIR HCL 1 G PO TABS: ORAL_TABLET | ORAL | 2 refills | Status: DC

## 2021-03-31 MED ORDER — TIZANIDINE HCL 4 MG PO TABS: 4.0000 mg | ORAL_TABLET | Freq: Four times a day (QID) | ORAL | 0 refills | Status: DC | PRN
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
## 2021-03-31 NOTE — Patient Instructions (Addendum)
-  blood pressure is great! Routine labs today. Keep up the good work.   -refilled your valtrex for fever blisters  -ringing in your ears maybe transient. Keep an eye on it and let me know if it's getting worse, I typically send you to the ENT doctor.   -neck pain ( you have a big knot in your neck!) sent in muscle relaxer for you to use as needed. Would cut the pill in 1/2 at first to see if it makes you drowsy at all. Can use heating pad and also rub voltaren gel on it 4x/day. Let me know if this doesn't get better.   -shoulder pain: appears to possibly be your biceps tendon again. I can refer you to sports med (they can inject steroid shot if needed) or we can try PT again. Give it a week then call me and let me know what you think. Exam is normal for rotator cuff so do not think we need mri at this time.   I put in referral for sports medicine! :)   So good to see you!  Dr. Rogers Blocker
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
## 2021-04-02 ENCOUNTER — Ambulatory Visit (INDEPENDENT_AMBULATORY_CARE_PROVIDER_SITE_OTHER): Payer: Medicare Other | Admitting: Family Medicine

## 2021-04-02 ENCOUNTER — Ambulatory Visit: Payer: Self-pay

## 2021-04-02 ENCOUNTER — Other Ambulatory Visit: Payer: Self-pay

## 2021-04-02 ENCOUNTER — Ambulatory Visit (INDEPENDENT_AMBULATORY_CARE_PROVIDER_SITE_OTHER): Payer: Medicare Other

## 2021-04-02 VITALS — BP 146/88 | HR 88 | Ht 65.5 in | Wt 166.0 lb

## 2021-04-02 DIAGNOSIS — G8929 Other chronic pain: Secondary | ICD-10-CM

## 2021-04-02 DIAGNOSIS — M25511 Pain in right shoulder: Secondary | ICD-10-CM | POA: Diagnosis not present

## 2021-04-02 NOTE — Patient Instructions (Addendum)
Thank you for coming in today. Have a great weekend!  Please get an Xray today before you leave today.  I've referred you to Physical Therapy.  Let us know if you don't hear from them in one week.  Please complete the exercises that the athletic trainer went over with you: View at [email or web address] using code: [email or web address]  Recheck in about 6 weeks.   If you are not doing well let me know.

## 2021-04-05 NOTE — Progress Notes (Signed)
Right shoulder x-ray shows mild arthritis of the small joint at the top of the shoulder (the [facility] joint)

## 2021-04-06 ENCOUNTER — Telehealth: Payer: Self-pay

## 2021-04-06 DIAGNOSIS — S46001A Unspecified injury of muscle(s) and tendon(s) of the rotator cuff of right shoulder, initial encounter: Secondary | ICD-10-CM | POA: Diagnosis not present

## 2021-04-06 DIAGNOSIS — M19011 Primary osteoarthritis, right shoulder: Secondary | ICD-10-CM | POA: Diagnosis not present

## 2021-04-06 NOTE — Telephone Encounter (Signed)
Patient states that she was prescribed tiZANidine (ZANAFLEX) 4 MG tablet and she was originally asked if she wanted the non drowsy or the drowsy, she choose the non one but is now wondering if she can get the medication that will make her drowsy as she is having trouble sleeping.

## 2021-04-06 NOTE — Telephone Encounter (Signed)
I spoke with the pt to address pt's concerns. Pt confirmed message below.   Please advise.

## 2021-04-07 ENCOUNTER — Other Ambulatory Visit: Payer: Self-pay | Admitting: Family Medicine

## 2021-04-07 MED ORDER — CYCLOBENZAPRINE HCL 5 MG PO TABS: 5.0000 mg | ORAL_TABLET | Freq: Every evening | ORAL | 1 refills | Status: DC | PRN

## 2021-04-07 NOTE — Telephone Encounter (Signed)
Let her know I can do this, but she can not take both of these muscle relaxers together.  Orma Flaming, MD [place]

## 2021-04-08 NOTE — Telephone Encounter (Signed)
I spoke with the Nicole Long to give message below. Nicole Long is now saying if she commits to taking the "drowsy" muscle relaxer, than she will be in pain all day. She states that she does not want to be in all pain all day. I asked Nicole Long if she wanted to follow up with Dr. Rogers Blocker about a pain medication, and she states that this is not what she is talking about. Nicole Long says that the directions for the "non drowsy" muscle relaxer says that she can take q6h prn, and would need to know if she can take "drowsy" muscle relaxer, q6h? I advised that she only take it at bedtime as prescribed. Nicole Long is unclear on what she needs to do. I recommended that she follow up with Dr. Rogers Blocker to clarify further instruction.

## 2021-04-09 DIAGNOSIS — S46001A Unspecified injury of muscle(s) and tendon(s) of the rotator cuff of right shoulder, initial encounter: Secondary | ICD-10-CM | POA: Diagnosis not present

## 2021-04-09 DIAGNOSIS — M19011 Primary osteoarthritis, right shoulder: Secondary | ICD-10-CM | POA: Diagnosis not present

## 2021-04-13 DIAGNOSIS — S46001A Unspecified injury of muscle(s) and tendon(s) of the rotator cuff of right shoulder, initial encounter: Secondary | ICD-10-CM | POA: Diagnosis not present

## 2021-04-13 DIAGNOSIS — M19011 Primary osteoarthritis, right shoulder: Secondary | ICD-10-CM | POA: Diagnosis not present

## 2021-04-14 ENCOUNTER — Telehealth: Payer: Self-pay

## 2021-04-14 NOTE — Telephone Encounter (Signed)
Patient would like a call back regarding her tizadine medication.

## 2021-04-14 NOTE — Telephone Encounter (Signed)
I spoke with the pt to address medication concerns. Pt is requesting refill. She says that she is sleeping better, but would like a refill for the shoulder pain. Pt was told to follow up with Dr. Georgina Snell for refills on this medication, since being seen for shoulder pain in sports medicine. She was unhappy and did not understand why she could not get another refill by Dr. Rogers Blocker. I advised her to contact Dr. Clovis Riley office because he is monitoring her shoulder pain.  Pt still seems to be frustrated.

## 2021-04-14 NOTE — Telephone Encounter (Signed)
Pt called, would like to continue on her Tizanidine but is almost out of the meds. Originally rx'd by PCP but was informed to contact Dr. Georgina Snell for any refills of this medication. [facility] in [place]

## 2021-04-15 DIAGNOSIS — S46001A Unspecified injury of muscle(s) and tendon(s) of the rotator cuff of right shoulder, initial encounter: Secondary | ICD-10-CM | POA: Diagnosis not present

## 2021-04-15 DIAGNOSIS — M19011 Primary osteoarthritis, right shoulder: Secondary | ICD-10-CM | POA: Diagnosis not present

## 2021-04-15 MED ORDER — TIZANIDINE HCL 4 MG PO TABS
4.0000 mg | ORAL_TABLET | Freq: Three times a day (TID) | ORAL | 1 refills | Status: DC | PRN
Start: 2021-04-15 — End: 2021-06-16

## 2021-04-15 NOTE — Addendum Note (Signed)
Addended by: Gregor Hams on: 04/15/2021 09:37 AM   Modules accepted: Orders

## 2021-04-15 NOTE — Telephone Encounter (Signed)
Tizanidine refilled. Continue physical therapy.

## 2021-04-20 DIAGNOSIS — S46001A Unspecified injury of muscle(s) and tendon(s) of the rotator cuff of right shoulder, initial encounter: Secondary | ICD-10-CM | POA: Diagnosis not present

## 2021-04-20 DIAGNOSIS — M19011 Primary osteoarthritis, right shoulder: Secondary | ICD-10-CM | POA: Diagnosis not present

## 2021-04-22 DIAGNOSIS — M19011 Primary osteoarthritis, right shoulder: Secondary | ICD-10-CM | POA: Diagnosis not present

## 2021-04-22 DIAGNOSIS — S46001A Unspecified injury of muscle(s) and tendon(s) of the rotator cuff of right shoulder, initial encounter: Secondary | ICD-10-CM | POA: Diagnosis not present

## 2021-04-27 DIAGNOSIS — M19011 Primary osteoarthritis, right shoulder: Secondary | ICD-10-CM | POA: Diagnosis not present

## 2021-04-27 DIAGNOSIS — S46001A Unspecified injury of muscle(s) and tendon(s) of the rotator cuff of right shoulder, initial encounter: Secondary | ICD-10-CM | POA: Diagnosis not present

## 2021-04-29 DIAGNOSIS — S46001A Unspecified injury of muscle(s) and tendon(s) of the rotator cuff of right shoulder, initial encounter: Secondary | ICD-10-CM | POA: Diagnosis not present

## 2021-04-29 DIAGNOSIS — M19011 Primary osteoarthritis, right shoulder: Secondary | ICD-10-CM | POA: Diagnosis not present

## 2021-04-30 ENCOUNTER — Other Ambulatory Visit: Payer: Self-pay

## 2021-04-30 ENCOUNTER — Ambulatory Visit: Payer: Medicare Other | Attending: Internal Medicine

## 2021-04-30 ENCOUNTER — Other Ambulatory Visit (HOSPITAL_BASED_OUTPATIENT_CLINIC_OR_DEPARTMENT_OTHER): Payer: Self-pay

## 2021-04-30 DIAGNOSIS — Z23 Encounter for immunization: Secondary | ICD-10-CM

## 2021-04-30 MED ORDER — PFIZER-BIONT COVID-19 VAC-TRIS 30 MCG/0.3ML IM SUSP
INTRAMUSCULAR | 0 refills | Status: DC
  Filled 2021-04-30: qty 0.3, 1d supply, fill #0

## 2021-04-30 NOTE — Progress Notes (Signed)
   Covid-19 Vaccination Clinic  Name:  Nicole Long    MRN: [id number] DOB: 08-Feb-1951  04/30/2021  [name] was observed post Covid-19 immunization for 15 minutes without incident. She was provided with Vaccine Information Sheet and instruction to access the V-Safe system.   [name] was instructed to call 911 with any severe reactions post vaccine: [name] Difficulty breathing  . Swelling of face and throat  . A fast heartbeat  . A bad rash all over body  . Dizziness and weakness   Immunizations Administered    Name Date Dose VIS Date Route   PFIZER Comrnaty(Gray TOP) Covid-19 Vaccine 04/30/2021  9:59 AM 0.3 mL 11/19/2020 Intramuscular   Manufacturer: [employer]   Lot: [id number]   NDC: [phone]

## 2021-05-04 DIAGNOSIS — M19011 Primary osteoarthritis, right shoulder: Secondary | ICD-10-CM | POA: Diagnosis not present

## 2021-05-04 DIAGNOSIS — S46001A Unspecified injury of muscle(s) and tendon(s) of the rotator cuff of right shoulder, initial encounter: Secondary | ICD-10-CM | POA: Diagnosis not present

## 2021-05-06 DIAGNOSIS — M19011 Primary osteoarthritis, right shoulder: Secondary | ICD-10-CM | POA: Diagnosis not present

## 2021-05-06 DIAGNOSIS — S46001A Unspecified injury of muscle(s) and tendon(s) of the rotator cuff of right shoulder, initial encounter: Secondary | ICD-10-CM | POA: Diagnosis not present

## 2021-05-11 DIAGNOSIS — S46001A Unspecified injury of muscle(s) and tendon(s) of the rotator cuff of right shoulder, initial encounter: Secondary | ICD-10-CM | POA: Diagnosis not present

## 2021-05-11 DIAGNOSIS — M19011 Primary osteoarthritis, right shoulder: Secondary | ICD-10-CM | POA: Diagnosis not present

## 2021-05-13 DIAGNOSIS — S46001A Unspecified injury of muscle(s) and tendon(s) of the rotator cuff of right shoulder, initial encounter: Secondary | ICD-10-CM | POA: Diagnosis not present

## 2021-05-13 DIAGNOSIS — M19011 Primary osteoarthritis, right shoulder: Secondary | ICD-10-CM | POA: Diagnosis not present

## 2021-05-17 NOTE — Progress Notes (Signed)
   I, Wendy Poet, LAT, ATC, am serving as scribe for Dr. Lynne Leader.  Nicole Long is a 70 y.o. female who presents to [place] at [facility] today for f/u of chronic, recurrent R shoulder pain.  She was last seen by Dr. Georgina Snell on 04/02/21 and was referred to PT at [facility].  She was also shown a HEP.  Since her last visit, pt reports some progress with her shoulder pain. Pt notes increased pain in R arm at night and describes pain as "burning" into R elbow and wrist/hand. Pt has been having 2 PT visits per week.  Despite her above symptoms overall she thinks she is improving quite a bit and is happy with how she is progressing.  She is interested in continuing PT and progressing to home exercise program.   Diagnostic imaging: R shoulder XR- 04/02/21  Pertinent review of systems: No fevers or chills  Relevant historical information: Pretension   Exam:  BP (!) 148/92 (BP Location: Left Arm, Patient Position: Sitting, Cuff Size: Normal)   Pulse 87   Ht 5' 5.5" (1.664 m)   Wt 167 lb 12.8 oz (76.1 kg)   LMP  (LMP Unknown)   SpO2 99%   BMI 27.50 kg/m  General: Well Developed, well nourished, and in no acute distress.   MSK: C-spine normal-appearing nontender midline normal cervical motion. Right shoulder normal-appearing normal shoulder motion. Intact strength. Negative impingement testing.     Lab and Radiology Results  EXAM: RIGHT SHOULDER - 2+ VIEW  COMPARISON:  None.  FINDINGS: There is no evidence of fracture or dislocation. Mild degenerative changes are seen within the right acromioclavicular joint. A 4 mm benign-appearing sclerotic focus is seen within the right humeral head. Soft tissues are unremarkable.  IMPRESSION: Mild degenerative changes within the right acromioclavicular joint.   Electronically Signed   By: Virgina Norfolk M.D.   On: 04/02/2021 20:38 I, Lynne Leader, personally (independently) visualized and performed the  interpretation of the images attached in this note.  Diagnostic Limited MSK Ultrasound ZR:[id number] shoulder Visible tendon intact normal-appearing Subscapularis tendon is intact. Hyperechoic calcification at distal tendon insertion indicates chronic tendinopathy history. Supraspinatus tendon with small intrasubstance hypoechoic change consistent with small intrasubstance tear. Calcific change distal tendon insertion showing chronic calcific tendinopathy. Moderate subacromial bursitis present. Infraspinatus tendon is intact. AC DJD with effusion. Impression:Probable small nonretracted intrasubstance supraspinatus tear. Chronic calcific changes subscapularis and supraspinatus tendon Subacromial bursitis AC DJD.    Assessment and Plan: 71 y.o. female with right shoulder and neck pain multifactorial.  Patient has done extremely well with physical therapy.  Overall she is quite satisfied with her progress so far.  Plan to continue the remaining physical therapy and transition to home exercise program.  After discussion plan to recheck in 6 weeks.  If not better would consider MRI or even injection as next step.  MRI shoulder is probably next best option.    Discussed warning signs or symptoms. Please see discharge instructions. Patient expresses understanding.   The above documentation has been reviewed and is accurate and complete Lynne Leader, M.D.  Total encounter time 20 minutes including face-to-face time with the patient and, reviewing past medical record, and charting on the date of service.   Treatment plan and options

## 2021-05-18 ENCOUNTER — Ambulatory Visit (INDEPENDENT_AMBULATORY_CARE_PROVIDER_SITE_OTHER): Payer: Medicare Other | Admitting: Family Medicine

## 2021-05-18 ENCOUNTER — Other Ambulatory Visit: Payer: Self-pay

## 2021-05-18 VITALS — BP 148/92 | HR 87 | Ht 65.5 in | Wt 167.8 lb

## 2021-05-18 DIAGNOSIS — M25511 Pain in right shoulder: Secondary | ICD-10-CM | POA: Diagnosis not present

## 2021-05-18 DIAGNOSIS — G8929 Other chronic pain: Secondary | ICD-10-CM

## 2021-05-18 NOTE — Patient Instructions (Signed)
Thank you for coming in today.  Continue limited PT and home exercises.   Recheck in 6 weeks.   Let me know sooner if you are not doing well.

## 2021-05-19 DIAGNOSIS — M19011 Primary osteoarthritis, right shoulder: Secondary | ICD-10-CM | POA: Diagnosis not present

## 2021-05-19 DIAGNOSIS — S46001A Unspecified injury of muscle(s) and tendon(s) of the rotator cuff of right shoulder, initial encounter: Secondary | ICD-10-CM | POA: Diagnosis not present

## 2021-05-21 DIAGNOSIS — S46001A Unspecified injury of muscle(s) and tendon(s) of the rotator cuff of right shoulder, initial encounter: Secondary | ICD-10-CM | POA: Diagnosis not present

## 2021-05-21 DIAGNOSIS — M19011 Primary osteoarthritis, right shoulder: Secondary | ICD-10-CM | POA: Diagnosis not present

## 2021-05-25 DIAGNOSIS — S46001A Unspecified injury of muscle(s) and tendon(s) of the rotator cuff of right shoulder, initial encounter: Secondary | ICD-10-CM | POA: Diagnosis not present

## 2021-05-25 DIAGNOSIS — M19011 Primary osteoarthritis, right shoulder: Secondary | ICD-10-CM | POA: Diagnosis not present

## 2021-05-27 DIAGNOSIS — S46001A Unspecified injury of muscle(s) and tendon(s) of the rotator cuff of right shoulder, initial encounter: Secondary | ICD-10-CM | POA: Diagnosis not present

## 2021-05-27 DIAGNOSIS — M19011 Primary osteoarthritis, right shoulder: Secondary | ICD-10-CM | POA: Diagnosis not present

## 2021-06-01 DIAGNOSIS — M19011 Primary osteoarthritis, right shoulder: Secondary | ICD-10-CM | POA: Diagnosis not present

## 2021-06-01 DIAGNOSIS — S46001A Unspecified injury of muscle(s) and tendon(s) of the rotator cuff of right shoulder, initial encounter: Secondary | ICD-10-CM | POA: Diagnosis not present

## 2021-06-03 DIAGNOSIS — M19011 Primary osteoarthritis, right shoulder: Secondary | ICD-10-CM | POA: Diagnosis not present

## 2021-06-03 DIAGNOSIS — S46001A Unspecified injury of muscle(s) and tendon(s) of the rotator cuff of right shoulder, initial encounter: Secondary | ICD-10-CM | POA: Diagnosis not present

## 2021-06-08 DIAGNOSIS — M19011 Primary osteoarthritis, right shoulder: Secondary | ICD-10-CM | POA: Diagnosis not present

## 2021-06-08 DIAGNOSIS — S46001A Unspecified injury of muscle(s) and tendon(s) of the rotator cuff of right shoulder, initial encounter: Secondary | ICD-10-CM | POA: Diagnosis not present

## 2021-06-10 DIAGNOSIS — S46001A Unspecified injury of muscle(s) and tendon(s) of the rotator cuff of right shoulder, initial encounter: Secondary | ICD-10-CM | POA: Diagnosis not present

## 2021-06-10 DIAGNOSIS — M19011 Primary osteoarthritis, right shoulder: Secondary | ICD-10-CM | POA: Diagnosis not present

## 2021-06-15 DIAGNOSIS — S46001A Unspecified injury of muscle(s) and tendon(s) of the rotator cuff of right shoulder, initial encounter: Secondary | ICD-10-CM | POA: Diagnosis not present

## 2021-06-15 DIAGNOSIS — M19011 Primary osteoarthritis, right shoulder: Secondary | ICD-10-CM | POA: Diagnosis not present

## 2021-06-16 ENCOUNTER — Other Ambulatory Visit: Payer: Self-pay | Admitting: Family Medicine

## 2021-06-16 MED ORDER — TIZANIDINE HCL 4 MG PO TABS: 4.0000 mg | ORAL_TABLET | Freq: Three times a day (TID) | ORAL | 1 refills | Status: DC | PRN

## 2021-06-17 DIAGNOSIS — M19011 Primary osteoarthritis, right shoulder: Secondary | ICD-10-CM | POA: Diagnosis not present

## 2021-06-17 DIAGNOSIS — S46001A Unspecified injury of muscle(s) and tendon(s) of the rotator cuff of right shoulder, initial encounter: Secondary | ICD-10-CM | POA: Diagnosis not present

## 2021-06-22 DIAGNOSIS — S46001A Unspecified injury of muscle(s) and tendon(s) of the rotator cuff of right shoulder, initial encounter: Secondary | ICD-10-CM | POA: Diagnosis not present

## 2021-06-22 DIAGNOSIS — M19011 Primary osteoarthritis, right shoulder: Secondary | ICD-10-CM | POA: Diagnosis not present

## 2021-06-29 ENCOUNTER — Ambulatory Visit: Payer: Medicare Other | Admitting: Family Medicine

## 2021-06-29 ENCOUNTER — Other Ambulatory Visit: Payer: Self-pay

## 2021-06-29 ENCOUNTER — Ambulatory Visit (INDEPENDENT_AMBULATORY_CARE_PROVIDER_SITE_OTHER): Payer: Medicare Other | Admitting: Family Medicine

## 2021-06-29 ENCOUNTER — Encounter: Payer: Self-pay | Admitting: Family Medicine

## 2021-06-29 VITALS — BP 130/80 | HR 91 | Ht 65.5 in | Wt 170.0 lb

## 2021-06-29 DIAGNOSIS — G8929 Other chronic pain: Secondary | ICD-10-CM

## 2021-06-29 DIAGNOSIS — M25511 Pain in right shoulder: Secondary | ICD-10-CM | POA: Diagnosis not present

## 2021-06-29 NOTE — Patient Instructions (Signed)
Thank you for coming in today.   Recheck as needed.   Continue home exercises.   Next step for the shoulder if it worsens is probably an MRI.   We could do PT again in the future.  Just let me know.   I can also do a shot as needed as well.

## 2021-06-29 NOTE — Progress Notes (Signed)
Nicole Long, am serving as a Education administrator for Dr. Lynne Leader.   Nicole Long is a 70 y.o. female who presents to [place] at [facility] today for f/u chronic R shoulder and neck pain. Pt was last seen by Dr. Georgina Snell on 05/18/21 and was advised was advised to finish out remaining PT and transition to HEP. Today, pt reports that she is feeling a lot better than last visit about 75% better. Patient still has things that she cannot lift, pull, or reach above head to wash hair. Patient still has some numbness in her R hand thumb pointer finger and some sore pain in R arm and stiff neck. States that once she does her neck exercises in the AM will help with the neck stiffness.  Dx imaging: 04/02/21 R shoulder XR  Pertinent review of systems: No fevers or chills  Relevant historical information: Hypertension   Exam:  BP 130/80 (BP Location: Left Arm, Patient Position: Sitting, Cuff Size: Normal)   Pulse 91   Ht 5' 5.5" (1.664 m)   Wt 170 lb (77.1 kg)   LMP  (LMP Unknown)   SpO2 99%   BMI 27.86 kg/m  General: Well Developed, well nourished, and in no acute distress.   MSK: Right shoulder normal-appearing nontender normal motion normal strength negative impingement testing. C-spine normal motion    Lab and Radiology Results    EXAM: RIGHT SHOULDER - 2+ VIEW   COMPARISON:  None.   FINDINGS: There is no evidence of fracture or dislocation. Mild degenerative changes are seen within the right acromioclavicular joint. A 4 mm benign-appearing sclerotic focus is seen within the right humeral head. Soft tissues are unremarkable.   IMPRESSION: Mild degenerative changes within the right acromioclavicular joint.     Electronically Signed   By: Virgina Norfolk M.D.   On: 04/02/2021 20:38   Diagnostic Limited MSK Ultrasound of: Right shoulder Visible tendon intact normal-appearing Subscapularis tendon is intact.  Hyperechoic calcification at distal tendon  insertion indicates chronic tendinopathy history. Supraspinatus tendon with small intrasubstance hypoechoic change consistent with small intrasubstance tear. Calcific change distal tendon insertion showing chronic calcific tendinopathy. Moderate subacromial bursitis present. Infraspinatus tendon is intact. AC DJD with effusion. Impression: Probable small nonretracted intrasubstance supraspinatus tear.  Chronic calcific changes subscapularis and supraspinatus tendon Subacromial bursitis AC DJD.  I, Lynne Leader, personally (independently) visualized and performed the interpretation of the images attached in this note.     Assessment and Plan: 70 y.o. female with right shoulder pain due to rotator cuff tendinopathy and possible tear of the supraspinatus tendon.  Patient is done extremely well with physical therapy and conservative management over the last 3 months.  She is transitioning to home exercise on her own.  After discussion today plan for watchful waiting.  Next step would probably be MRI and prior to injections or potential surgical planning.  She does have a tiny bit of numbness and tingling in the thumb to index finger area right hand that is probably due to cervical radiculopathy but this is again very well controlled and will be treated with watchful waiting.  Certainly could reauthorize physical therapy in the future if needed.    Discussed warning signs or symptoms. Please see discharge instructions. Patient expresses understanding.   The above documentation has been reviewed and is accurate and complete Lynne Leader, M.D.   Total encounter time 20 minutes including face-to-face time with the patient and, reviewing past medical record, and charting on the  date of service.   Treatment plan and options

## 2021-09-09 ENCOUNTER — Other Ambulatory Visit (HOSPITAL_BASED_OUTPATIENT_CLINIC_OR_DEPARTMENT_OTHER): Payer: Self-pay

## 2021-09-09 ENCOUNTER — Ambulatory Visit: Payer: Medicare Other | Attending: Internal Medicine

## 2021-09-09 DIAGNOSIS — Z23 Encounter for immunization: Secondary | ICD-10-CM

## 2021-09-09 MED ORDER — PFIZER COVID-19 VAC BIVALENT 30 MCG/0.3ML IM SUSP
INTRAMUSCULAR | 0 refills | Status: DC
  Filled 2021-09-09: qty 0.3, 1d supply, fill #0

## 2021-09-09 NOTE — Progress Notes (Signed)
   Covid-19 Vaccination Clinic  Name:  Nicole Long    MRN: [id number] DOB: Oct 03, 1951  09/09/2021  [name] was observed post Covid-19 immunization for 15 minutes without incident. She was provided with Vaccine Information Sheet and instruction to access the V-Safe system.   [name] was instructed to call 911 with any severe reactions post vaccine: Difficulty breathing  Swelling of face and throat  A fast heartbeat  A bad rash all over body  Dizziness and weakness

## 2021-09-17 ENCOUNTER — Other Ambulatory Visit (HOSPITAL_BASED_OUTPATIENT_CLINIC_OR_DEPARTMENT_OTHER): Payer: Self-pay

## 2021-09-17 MED ORDER — INFLUENZA VAC A&B SA ADJ QUAD 0.5 ML IM PRSY
PREFILLED_SYRINGE | INTRAMUSCULAR | 0 refills | Status: DC
  Filled 2021-09-17: qty 0.5, 1d supply, fill #0

## 2021-09-30 ENCOUNTER — Other Ambulatory Visit: Payer: Self-pay

## 2021-09-30 ENCOUNTER — Ambulatory Visit (INDEPENDENT_AMBULATORY_CARE_PROVIDER_SITE_OTHER): Payer: Medicare Other

## 2021-09-30 DIAGNOSIS — Z23 Encounter for immunization: Secondary | ICD-10-CM | POA: Diagnosis not present

## 2021-11-02 ENCOUNTER — Other Ambulatory Visit (HOSPITAL_BASED_OUTPATIENT_CLINIC_OR_DEPARTMENT_OTHER): Payer: Self-pay | Admitting: Nephrology

## 2021-11-02 ENCOUNTER — Other Ambulatory Visit (HOSPITAL_BASED_OUTPATIENT_CLINIC_OR_DEPARTMENT_OTHER): Payer: Self-pay | Admitting: Family Medicine

## 2021-11-02 DIAGNOSIS — Z1231 Encounter for screening mammogram for malignant neoplasm of breast: Secondary | ICD-10-CM

## 2021-11-08 ENCOUNTER — Other Ambulatory Visit: Payer: Self-pay | Admitting: Family Medicine

## 2021-11-12 ENCOUNTER — Ambulatory Visit (INDEPENDENT_AMBULATORY_CARE_PROVIDER_SITE_OTHER): Payer: Medicare Other | Admitting: Family Medicine

## 2021-11-12 ENCOUNTER — Encounter: Payer: Self-pay | Admitting: Family Medicine

## 2021-11-12 VITALS — BP 124/82 | HR 90 | Temp 97.6°F | Ht 65.5 in | Wt 170.8 lb

## 2021-11-12 DIAGNOSIS — M549 Dorsalgia, unspecified: Secondary | ICD-10-CM

## 2021-11-12 DIAGNOSIS — R7303 Prediabetes: Secondary | ICD-10-CM

## 2021-11-12 DIAGNOSIS — I10 Essential (primary) hypertension: Secondary | ICD-10-CM | POA: Diagnosis not present

## 2021-11-12 DIAGNOSIS — G8929 Other chronic pain: Secondary | ICD-10-CM | POA: Diagnosis not present

## 2021-11-12 DIAGNOSIS — E559 Vitamin D deficiency, unspecified: Secondary | ICD-10-CM | POA: Diagnosis not present

## 2021-11-12 MED ORDER — VALACYCLOVIR HCL 1 G PO TABS: ORAL_TABLET | ORAL | 2 refills | Status: DC

## 2021-11-12 MED ORDER — HYDROCORTISONE 2.5 % EX CREA: TOPICAL_CREAM | Freq: Two times a day (BID) | CUTANEOUS | 0 refills | Status: DC

## 2021-11-12 NOTE — Assessment & Plan Note (Signed)
No red flags.  She has seen physical therapy and sports medicine.  Will place referral to massage therapy.

## 2021-11-12 NOTE — Assessment & Plan Note (Signed)
Check A1c next blood draw.  Continue lifestyle modifications.

## 2021-11-12 NOTE — Assessment & Plan Note (Addendum)
Initially elevated however at goal on recheck.  We will continue amlodipine 10 mg daily.  Continue home monitoring.  Recheck again in 6 months.

## 2021-11-12 NOTE — Patient Instructions (Signed)
It was very nice to see you today!  No changes today.  I will refill your medications.  I will refer you to see a massage therapist.  We will see you back in 6 months.  Please come back to see [place] sooner if needed.  Take care, Dr Jerline Pain  PLEASE NOTE:  If you had any lab tests please let us know if you have not heard back within a few days. You may see your results on mychart before we have a chance to review them but we will give you a call once they are reviewed by [place]. If we ordered any referrals today, please let us know if you have not heard from their office within the next week.   Please try these tips to maintain a healthy lifestyle:  Eat at least 3 REAL meals and 1-2 snacks per day.  Aim for no more than 5 hours between eating.  If you eat breakfast, please do so within one hour of getting up.   Each meal should contain half fruits/vegetables, one quarter protein, and one quarter carbs (no bigger than a computer mouse)  Cut down on sweet beverages. This includes juice, soda, and sweet tea.   Drink at least 1 glass of water with each meal and aim for at least 8 glasses per day  Exercise at least 150 minutes every week.

## 2021-11-12 NOTE — Assessment & Plan Note (Signed)
She is on vitamin D supplementation.  We can recheck vitamin D level next blood draw.
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
## 2021-12-08 ENCOUNTER — Other Ambulatory Visit: Payer: Self-pay

## 2022-02-01 ENCOUNTER — Ambulatory Visit (HOSPITAL_BASED_OUTPATIENT_CLINIC_OR_DEPARTMENT_OTHER)
Admission: RE | Admit: 2022-02-01 | Discharge: 2022-02-01 | Disposition: A | Discharge status: Home / Self Care | Payer: Medicare Other | Source: Ambulatory Visit | Attending: Family Medicine | Admitting: Family Medicine

## 2022-02-01 ENCOUNTER — Encounter (HOSPITAL_BASED_OUTPATIENT_CLINIC_OR_DEPARTMENT_OTHER): Payer: Self-pay

## 2022-02-01 ENCOUNTER — Other Ambulatory Visit: Payer: Self-pay

## 2022-02-01 DIAGNOSIS — Z1231 Encounter for screening mammogram for malignant neoplasm of breast: Secondary | ICD-10-CM | POA: Diagnosis not present

## 2022-02-07 ENCOUNTER — Other Ambulatory Visit: Payer: Self-pay | Admitting: Family Medicine

## 2022-03-15 ENCOUNTER — Other Ambulatory Visit (HOSPITAL_BASED_OUTPATIENT_CLINIC_OR_DEPARTMENT_OTHER): Payer: Self-pay

## 2022-03-15 MED ORDER — ZOSTER VAC RECOMB ADJUVANTED 50 MCG/0.5ML IM SUSR
INTRAMUSCULAR | 0 refills | Status: DC
  Filled 2022-03-15: qty 1, 1d supply, fill #0

## 2022-04-14 ENCOUNTER — Ambulatory Visit (INDEPENDENT_AMBULATORY_CARE_PROVIDER_SITE_OTHER): Payer: Medicare Other

## 2022-04-14 DIAGNOSIS — Z0000 Encounter for general adult medical examination without abnormal findings: Secondary | ICD-10-CM | POA: Diagnosis not present

## 2022-04-14 NOTE — Patient Instructions (Addendum)
Nicole Long , ?Thank you for taking time to come for your Medicare Wellness Visit. I appreciate your ongoing commitment to your health goals. Please review the following plan we discussed and let me know if I can assist you in the future.  ? ?Screening recommendations/referrals: ?Colonoscopy: Done 10/25/19 repeat every 7 years  ?Mammogram: Done 02/01/22 repeat every year  ?Bone Density: Done 09/27/16 repeat veery .tha years  ?Recommended yearly ophthalmology/optometry visit for glaucoma screening and checkup ?Recommended yearly dental visit for hygiene and checkup ? ?Vaccinations: ?Influenza vaccine: Done 09/30/21 repeat every year ?Pneumococcal vaccine: Up to date ?Tdap vaccine: Done 09/11/13 repeat every 10 years  ?Shingles vaccine: 03/15/22 1st dose    ?Covid-19:Completed 2/4, 3/1, 09/26/20 & 5/20, 09/09/21 ? ?Advanced directives: Advance directive discussed with you today. Even though you declined this today please call our office should you change your mind and we can give you the proper paperwork for you to fill out. ? ?Conditions/risks identified: To get back to walking  ? ?Next appointment: Follow up in one year for your annual wellness visit  ? ? ?Preventive Care 15 Years and Older, Female ?Preventive care refers to lifestyle choices and visits with your health care provider that can promote health and wellness. ?What does preventive care include? ?A yearly physical exam. This is also called an annual well check. ?Dental exams once or twice a year. ?Routine eye exams. Ask your health care provider how often you should have your eyes checked. ?Personal lifestyle choices, including: ?Daily care of your teeth and gums. ?Regular physical activity. ?Eating a healthy diet. ?Avoiding tobacco and drug use. ?Limiting alcohol use. ?Practicing safe sex. ?Taking low-dose aspirin every day. ?Taking vitamin and mineral supplements as recommended by your health care provider. ?What happens during an annual well check? ?The  services and screenings done by your health care provider during your annual well check will depend on your age, overall health, lifestyle risk factors, and family history of disease. ?Counseling  ?Your health care provider may ask you questions about your: ?Alcohol use. ?Tobacco use. ?Drug use. ?Emotional well-being. ?Home and relationship well-being. ?Sexual activity. ?Eating habits. ?History of falls. ?Memory and ability to understand (cognition). ?Work and work Statistician. ?Reproductive health. ?Screening  ?You may have the following tests or measurements: ?Height, weight, and BMI. ?Blood pressure. ?Lipid and cholesterol levels. These may be checked every 5 years, or more frequently if you are over 28 years old. ?Skin check. ?Lung cancer screening. You may have this screening every year starting at age 75 if you have a 30-pack-year history of smoking and currently smoke or have quit within the past 15 years. ?Fecal occult blood test (FOBT) of the stool. You may have this test every year starting at age 22. ?Flexible sigmoidoscopy or colonoscopy. You may have a sigmoidoscopy every 5 years or a colonoscopy every 10 years starting at age 31. ?Hepatitis C blood test. ?Hepatitis B blood test. ?Sexually transmitted disease (STD) testing. ?Diabetes screening. This is done by checking your blood sugar (glucose) after you have not eaten for a while (fasting). You may have this done every 1-3 years. ?Bone density scan. This is done to screen for osteoporosis. You may have this done starting at age 9. ?Mammogram. This may be done every 1-2 years. Talk to your health care provider about how often you should have regular mammograms. ?Talk with your health care provider about your test results, treatment options, and if necessary, the need for more tests. ?Vaccines  ?Your health  care provider may recommend certain vaccines, such as: ?Influenza vaccine. This is recommended every year. ?Tetanus, diphtheria, and acellular  pertussis (Tdap, Td) vaccine. You may need a Td booster every 10 years. ?Zoster vaccine. You may need this after age 26. ?Pneumococcal 13-valent conjugate (PCV13) vaccine. One dose is recommended after age 31. ?Pneumococcal polysaccharide (PPSV23) vaccine. One dose is recommended after age 58. ?Talk to your health care provider about which screenings and vaccines you need and how often you need them. ?This information is not intended to replace advice given to you by your health care provider. Make sure you discuss any questions you have with your health care provider. ?Document Released: 12/25/2015 Document Revised: 08/17/2016 Document Reviewed: 09/29/2015 ?Elsevier Interactive Patient Education ? 2017 [place]. ? ?Fall Prevention in the Home ?Falls can cause injuries. They can happen to people of all ages. There are many things you can do to make your home safe and to help prevent falls. ?What can I do on the outside of my home? ?Regularly fix the edges of walkways and driveways and fix any cracks. ?Remove anything that might make you trip as you walk through a door, such as a raised step or threshold. ?Trim any bushes or trees on the path to your home. ?Use bright outdoor lighting. ?Clear any walking paths of anything that might make someone trip, such as rocks or tools. ?Regularly check to see if handrails are loose or broken. Make sure that both sides of any steps have handrails. ?Any raised decks and porches should have guardrails on the edges. ?Have any leaves, snow, or ice cleared regularly. ?Use sand or salt on walking paths during winter. ?Clean up any spills in your garage right away. This includes oil or grease spills. ?What can I do in the bathroom? ?Use night lights. ?Install grab bars by the toilet and in the tub and shower. Do not use towel bars as grab bars. ?Use non-skid mats or decals in the tub or shower. ?If you need to sit down in the shower, use a plastic, non-slip stool. ?Keep the floor  dry. Clean up any water that spills on the floor as soon as it happens. ?Remove soap buildup in the tub or shower regularly. ?Attach bath mats securely with double-sided non-slip rug tape. ?Do not have throw rugs and other things on the floor that can make you trip. ?What can I do in the bedroom? ?Use night lights. ?Make sure that you have a light by your bed that is easy to reach. ?Do not use any sheets or blankets that are too big for your bed. They should not hang down onto the floor. ?Have a firm chair that has side arms. You can use this for support while you get dressed. ?Do not have throw rugs and other things on the floor that can make you trip. ?What can I do in the kitchen? ?Clean up any spills right away. ?Avoid walking on wet floors. ?Keep items that you use a lot in easy-to-reach places. ?If you need to reach something above you, use a strong step stool that has a grab bar. ?Keep electrical cords out of the way. ?Do not use floor polish or wax that makes floors slippery. If you must use wax, use non-skid floor wax. ?Do not have throw rugs and other things on the floor that can make you trip. ?What can I do with my stairs? ?Do not leave any items on the stairs. ?Make sure that there are handrails on  both sides of the stairs and use them. Fix handrails that are broken or loose. Make sure that handrails are as long as the stairways. ?Check any carpeting to make sure that it is firmly attached to the stairs. Fix any carpet that is loose or worn. ?Avoid having throw rugs at the top or bottom of the stairs. If you do have throw rugs, attach them to the floor with carpet tape. ?Make sure that you have a light switch at the top of the stairs and the bottom of the stairs. If you do not have them, ask someone to add them for you. ?What else can I do to help prevent falls? ?Wear shoes that: ?Do not have high heels. ?Have rubber bottoms. ?Are comfortable and fit you well. ?Are closed at the toe. Do not wear  sandals. ?If you use a stepladder: ?Make sure that it is fully opened. Do not climb a closed stepladder. ?Make sure that both sides of the stepladder are locked into place. ?Ask someone to hold it for you, if possible

## 2022-04-14 NOTE — Progress Notes (Signed)
Virtual Visit via Telephone Note ? ?I connected with  Nicole Long on 04/14/22 at 10:30 AM EDT by telephone and verified that I am speaking with the correct person using two identifiers. ? ?Medicare Annual Wellness visit completed telephonically due to Covid-19 pandemic.  ? ?Persons participating in this call: This Health Coach and this patient.  ? ?Location: ?Patient: Home ?Provider: Office  ?  ?I discussed the limitations, risks, security and privacy concerns of performing an evaluation and management service by telephone and the availability of in person appointments. The patient expressed understanding and agreed to proceed. ? ?Unable to perform video visit due to video visit attempted and failed and/or patient does not have video capability.  ? ?Some vital signs may be absent or patient reported.  ? ?Willette Brace, LPN ? ? ?Subjective:  ? Nicole Long is a 71 y.o. female who presents for Medicare Annual (Subsequent) preventive examination. ? ?Review of Systems    ? ?Cardiac Risk Factors include: advanced age (>74mn, >>88women);hypertension ? ?   ?Objective:  ?  ?There were no vitals filed for this visit. ?There is no height or weight on file to calculate BMI. ? ? ?  04/14/2022  ? 10:38 AM 10/12/2020  ? 11:57 AM 09/04/2019  ? 11:48 AM 11/28/2017  ?  9:17 AM  ?Advanced Directives  ?Does Patient Have a Medical Advance Directive? No Yes No No  ?Does patient want to make changes to medical advance directive?  Yes (MAU/Ambulatory/Procedural Areas - Information given)    ?Would patient like information on creating a medical advance directive? No - Patient declined  Yes (MAU/Ambulatory/Procedural Areas - Information given) Yes (MAU/Ambulatory/Procedural Areas - Information given)  ? ? ?Current Medications (verified) ?Outpatient Encounter Medications as of 04/14/2022  ?Medication Sig  ? amLODipine (NORVASC) 10 MG tablet TAKE 1 TABLET BY MOUTH EVERY DAY  ? Ascorbic Acid (VITAMIN C PO) Take 1 tablet by mouth daily.  ?  Cholecalciferol (VITAMIN D3) 2000 units TABS Take 1 tablet by mouth daily.  ? hydrocortisone 2.5 % cream Apply topically 2 (two) times daily.  ? Multiple Vitamin (MULTIVITAMIN) tablet Take 2 tablets by mouth daily.   ? valACYclovir (VALTREX) 1000 MG tablet 2 tabs po q12h x 2 doses as needed for fever blisters/cold sores  ? Zinc 30 MG TABS Take by mouth 3 (three) times a week.  ? Zoster Vaccine Adjuvanted ([facility] injection Inject into the muscle.  ? ?No facility-administered encounter medications on file as of 04/14/2022.  ? ? ?Allergies (verified) ?Hydrochlorothiazide  ? ?History: ?Past Medical History:  ?Diagnosis Date  ? Abnormal EKG   ? Inverted T waves in precordial leads: unchanged since 2006--this is her baseline  ? Bicipital tendinitis of right shoulder 02/2017  ? PT  ? Eczema   ? History of vitamin D deficiency   ? Normal range with high dose replacement x 12 wks Jan 2019.  ? Hx of adenomatous colonic polyps   ? Hyperlipidemia 09/2016  ? IFG (impaired fasting glucose) 09/2016  ? HbA1c 5.9%  ? Lipoma   ? Postmenopausal   ? White coat syndrome without diagnosis of hypertension   ? ?Past Surgical History:  ?Procedure Laterality Date  ? BUNIONECTOMY  2010 and 2011  ? COLONOSCOPY  11/18/2010,12/24/2013  ? Hx of adenomatous colon polyps.  [place]  Most recent 12/24/13: no polyps.  Recall 5 yrs (Dr. SShana Chute [facility]).  ? DEXA  09/27/2016  ? NORMAL.  Repeat 2 yrs.  ? GANGLION  CYST EXCISION  1988  ? [place]  ? POLYPECTOMY    ? TRANSTHORACIC ECHOCARDIOGRAM  07/04/14  ? Normal LV size and wall thickness, EF normal, grade I diast dysfxn  ? TUBAL LIGATION    ? ?Family History  ?Problem Relation Age of Onset  ? Congestive Heart Failure Mother   ? Stroke Sister   ?     passed away 5 03-23-2014  ? Breast cancer Sister   ? Cancer Brother   ? Thyroid disease Son   ? Breast cancer Sister   ? Stroke Sister   ? Kidney disease Sister   ? Kidney disease Brother   ? Heart disease Brother   ?      pacemaker  ? Colon cancer Neg Hx   ? Esophageal cancer Neg Hx   ? Rectal cancer Neg Hx   ? Stomach cancer Neg Hx   ? Colon polyps Neg Hx   ? ?Social History  ? ?Socioeconomic History  ? Marital status: Married  ?  Spouse name: Not on file  ? Number of children: Not on file  ? Years of education: Not on file  ? Highest education level: Not on file  ?Occupational History  ? Occupation: Retired  ?Tobacco Use  ? Smoking status: Former  ?  Types: Cigarettes  ?  Quit date: 09/11/1985  ?  Years since quitting: 36.6  ? Smokeless tobacco: Never  ?Vaping Use  ? Vaping Use: Never used  ?Substance and Sexual Activity  ? Alcohol use: Yes  ?  Comment: socially--occ wine  ? Drug use: No  ? Sexual activity: Not on file  ?Other Topics Concern  ? Not on file  ?Social History Narrative  ? Relocated to [place] March 23, 2009 (has PMD in [place], [place]--Dr. Logan Bores).  ? Married, has 7 y/o son and one step grandchild.  ? Retired Social worker.  ? Occ alc, no tob/drugs.  ? ?Social Determinants of Health  ? ?Financial Resource Strain: Low Risk   ? Difficulty of Paying Living Expenses: Not hard at all  ?Food Insecurity: No Food Insecurity  ? Worried About Charity fundraiser in the Last Year: Never true  ? Ran Out of Food in the Last Year: Never true  ?Transportation Needs: No Transportation Needs  ? Lack of Transportation (Medical): No  ? Lack of Transportation (Non-Medical): No  ?Physical Activity: Sufficiently Active  ? Days of Exercise per Week: 5 days  ? Minutes of Exercise per Session: 30 min  ?Stress: No Stress Concern Present  ? Feeling of Stress : Not at all  ?Social Connections: Moderately Isolated  ? Frequency of Communication with Friends and Family: More than three times a week  ? Frequency of Social Gatherings with Friends and Family: More than three times a week  ? Attends Religious Services: Never  ? Active Member of Clubs or Organizations: No  ? Attends Archivist Meetings: Never  ? Marital Status: Married  ? ? ?Tobacco  Counseling ?Counseling given: Not Answered ? ? ?Clinical Intake: ? ?Pre-visit preparation completed: Yes ? ?Pain : No/denies pain ? ?  ? ?BMI - recorded: 27.99 ?Nutritional Status: BMI 25 -29 Overweight ?Nutritional Risks: None ?Diabetes: No ? ?How often do you need to have someone help you when you read instructions, pamphlets, or other written materials from your doctor or pharmacy?: 1 - Never ? ?Diabetic?no ? ?Interpreter Needed?: No ? ?Information entered by :: Charlott Rakes, LPN ? ? ?Activities of Daily Living ? ?  04/14/2022  ? 10:40 AM  ?In your present state of health, do you have any difficulty performing the following activities:  ?Hearing? 0  ?Vision? 0  ?Difficulty concentrating or making decisions? 0  ?Walking or climbing stairs? 0  ?Dressing or bathing? 0  ?Doing errands, shopping? 0  ?Preparing Food and eating ? N  ?Using the Toilet? N  ?In the past six months, have you accidently leaked urine? N  ?Do you have problems with loss of bowel control? N  ?Managing your Medications? N  ?Managing your Finances? N  ?Housekeeping or managing your Housekeeping? N  ? ? ?Patient Care Team: ?Vivi Barrack, MD as PCP - General (Family Medicine) ?Fay Records, MD as Consulting Physician (Cardiology) ?Hale Bogus., MD as Consulting Physician (Gastroenterology) ?Jackquline Denmark, MD as Consulting Physician (Gastroenterology) ? ?Indicate any recent Medical Services you may have received from other than [facility] providers in the past year (date may be approximate). ? ?   ?Assessment:  ? This is a routine wellness examination for Adeana. ? ?Hearing/Vision screen ?Hearing Screening - Comments:: Pt denies any hearing issues  ?Vision Screening - Comments:: Pt encouraged to follow up with provider  ? ?Dietary issues and exercise activities discussed: ?Current Exercise Habits: The patient does not participate in regular exercise at present ? ? Goals Addressed   ? ?  ?  ?  ?  ? This Visit's Progress  ?  Patient Stated     ?   To get back to walking  ?  ? ?  ? ?Depression Screen ? ?  04/14/2022  ? 10:36 AM 11/12/2021  ?  8:55 AM 03/31/2021  ?  9:59 AM 10/12/2020  ? 11:53 AM 09/24/2020  ? 10:46 AM 09/04/2019  ? 11:49 AM 11/28/2017  ?  9

## 2022-05-08 ENCOUNTER — Other Ambulatory Visit: Payer: Self-pay | Admitting: Family Medicine

## 2022-05-17 ENCOUNTER — Ambulatory Visit (INDEPENDENT_AMBULATORY_CARE_PROVIDER_SITE_OTHER): Payer: Medicare Other | Admitting: Family Medicine

## 2022-05-17 ENCOUNTER — Encounter: Payer: Self-pay | Admitting: Family Medicine

## 2022-05-17 VITALS — BP 127/83 | HR 99 | Temp 98.0°F | Ht 65.5 in | Wt 171.8 lb

## 2022-05-17 DIAGNOSIS — E785 Hyperlipidemia, unspecified: Secondary | ICD-10-CM | POA: Diagnosis not present

## 2022-05-17 DIAGNOSIS — Q742 Other congenital malformations of lower limb(s), including pelvic girdle: Secondary | ICD-10-CM

## 2022-05-17 DIAGNOSIS — E559 Vitamin D deficiency, unspecified: Secondary | ICD-10-CM

## 2022-05-17 DIAGNOSIS — R7303 Prediabetes: Secondary | ICD-10-CM

## 2022-05-17 DIAGNOSIS — E538 Deficiency of other specified B group vitamins: Secondary | ICD-10-CM

## 2022-05-17 DIAGNOSIS — M722 Plantar fascial fibromatosis: Secondary | ICD-10-CM | POA: Diagnosis not present

## 2022-05-17 DIAGNOSIS — K219 Gastro-esophageal reflux disease without esophagitis: Secondary | ICD-10-CM

## 2022-05-17 DIAGNOSIS — I10 Essential (primary) hypertension: Secondary | ICD-10-CM | POA: Diagnosis not present

## 2022-05-17 NOTE — Patient Instructions (Signed)
It was very nice to see you today!  Please keep an eye blood pressure at home and let us know if it is persistently 140/90 or higher.  We will check blood work today.  Please come back in 6 months.  Come back sooner if needed.  Take care, Dr Jerline Pain  PLEASE NOTE:  If you had any lab tests please let us know if you have not heard back within a few days. You may see your results on mychart before we have a chance to review them but we will give you a call once they are reviewed by [place]. If we ordered any referrals today, please let us know if you have not heard from their office within the next week.   Please try these tips to maintain a healthy lifestyle:  Eat at least 3 REAL meals and 1-2 snacks per day.  Aim for no more than 5 hours between eating.  If you eat breakfast, please do so within one hour of getting up.   Each meal should contain half fruits/vegetables, one quarter protein, and one quarter carbs (no bigger than a computer mouse)  Cut down on sweet beverages. This includes juice, soda, and sweet tea.   Drink at least 1 glass of water with each meal and aim for at least 8 glasses per day  Exercise at least 150 minutes every week.

## 2022-05-17 NOTE — Assessment & Plan Note (Signed)
Check vitamin D. 

## 2022-05-17 NOTE — Assessment & Plan Note (Signed)
Flared up recently however she has been taking turmeric supplements and changed her footwear which seems to help significantly.  She will continue current treatment plan and let us know if symptoms do not continue to improve.

## 2022-05-17 NOTE — Assessment & Plan Note (Signed)
Check A1c. 

## 2022-05-17 NOTE — Assessment & Plan Note (Signed)
Initially elevated however much better controlled on recheck.  We will continue amlodipine 10 mg daily.  Continue home monitoring.  Recheck in 6 months.  We will check routine labs.

## 2022-05-17 NOTE — Progress Notes (Signed)
   Nicole Long is a 71 y.o. female who presents today for an office visit.  Assessment/Plan:  Chronic Problems Addressed Today: Plantar fasciitis Flared up recently however she has been taking turmeric supplements and changed her footwear which seems to help significantly.  She will continue current treatment plan and let us know if symptoms do not continue to improve.  Prediabetes Check A1c.  Vitamin D deficiency Check vitamin D.  HTN (hypertension), benign Initially elevated however much better controlled on recheck.  We will continue amlodipine 10 mg daily.  Continue home monitoring.  Recheck in 6 months.  We will check routine labs.     Subjective:  HPI:  See A/p for status of chronic conditions.         Objective:  Physical Exam: BP 127/83   Pulse 99   Temp 98 F (36.7 C) (Temporal)   Ht 5' 5.5" (1.664 m)   Wt 171 lb 12.8 oz (77.9 kg)   LMP  (LMP Unknown)   SpO2 100%   BMI 28.15 kg/m   Gen: No acute distress, resting comfortably CV: Regular rate and rhythm with no murmurs appreciated Pulm: Normal work of breathing, clear to auscultation bilaterally with no crackles, wheezes, or rhonchi Neuro: Grossly normal, moves all extremities Psych: Normal affect and thought content      Kahlia Lagunes M. Jerline Pain, MD 05/17/2022 11:34 AM

## 2022-05-24 ENCOUNTER — Other Ambulatory Visit: Payer: Medicare Other

## 2022-05-24 LAB — CBC
HCT: 36.9 % (ref 36.0–46.0)
Hemoglobin: 12.3 g/dL (ref 12.0–15.0)
MCHC: 33.3 g/dL (ref 30.0–36.0)
MCV: 93.9 fl (ref 78.0–100.0)
Platelets: 235 10*3/uL (ref 150.0–400.0)
RBC: 3.93 Mil/uL (ref 3.87–5.11)
RDW: 13.4 % (ref 11.5–15.5)
WBC: 3.8 10*3/uL — ABNORMAL LOW (ref 4.0–10.5)

## 2022-05-24 LAB — COMPREHENSIVE METABOLIC PANEL
ALT: 16 U/L (ref 0–35)
AST: 23 U/L (ref 0–37)
Albumin: 4.2 g/dL (ref 3.5–5.2)
Alkaline Phosphatase: 75 U/L (ref 39–117)
BUN: 10 mg/dL (ref 6–23)
CO2: 27 mEq/L (ref 19–32)
Calcium: 9.6 mg/dL (ref 8.4–10.5)
Chloride: 105 mEq/L (ref 96–112)
Creatinine, Ser: 0.75 mg/dL (ref 0.40–1.20)
GFR: 80.21 mL/min (ref >60.00)
Glucose, Bld: 89 mg/dL (ref 70–99)
Potassium: 3.6 mEq/L (ref 3.5–5.1)
Sodium: 138 mEq/L (ref 135–145)
Total Bilirubin: 0.7 mg/dL (ref 0.2–1.2)
Total Protein: 7.3 g/dL (ref 6.0–8.3)

## 2022-05-24 LAB — VITAMIN D 25 HYDROXY (VIT D DEFICIENCY, FRACTURES): VITD: 77.57 ng/mL (ref 30.00–100.00)

## 2022-05-24 LAB — LIPID PANEL
Cholesterol: 202 mg/dL — ABNORMAL HIGH (ref 0–200)
HDL: 62.5 mg/dL (ref >39.00)
LDL Cholesterol: 121 mg/dL — ABNORMAL HIGH (ref 0–99)
NonHDL: 139.51
Total CHOL/HDL Ratio: 3
Triglycerides: 94 mg/dL (ref 0.0–149.0)
VLDL: 18.8 mg/dL (ref 0.0–40.0)

## 2022-05-24 LAB — VITAMIN B12: Vitamin B-12: 778 pg/mL (ref 211–911)

## 2022-05-24 LAB — HEMOGLOBIN A1C: Hgb A1c MFr Bld: 5.8 % (ref 4.6–6.5)

## 2022-05-24 LAB — TSH: TSH: 2.08 u[IU]/mL (ref 0.35–5.50)

## 2022-05-28 NOTE — Progress Notes (Signed)
Please inform patient of the following:  Great news! Her labs are all stable. Do not need to make any changes to her treatment plan at this time and we can recheck in a year.  Nicole Long. Jerline Pain, MD 05/28/2022 9:05 AM

## 2022-06-01 ENCOUNTER — Telehealth: Payer: Self-pay | Admitting: Family Medicine

## 2022-06-01 NOTE — Telephone Encounter (Signed)
Patient aware B12 and Vit D with normal limits  Stated taking Zinc

## 2022-06-01 NOTE — Telephone Encounter (Signed)
Pt is unclear if additional supplements should be taken daily for the B12 or if she will be getting an injection and that is enough.  Please call back.

## 2022-06-07 ENCOUNTER — Other Ambulatory Visit (HOSPITAL_BASED_OUTPATIENT_CLINIC_OR_DEPARTMENT_OTHER): Payer: Self-pay

## 2022-06-17 ENCOUNTER — Other Ambulatory Visit (HOSPITAL_BASED_OUTPATIENT_CLINIC_OR_DEPARTMENT_OTHER): Payer: Self-pay

## 2022-06-17 MED ORDER — SHINGRIX 50 MCG/0.5ML IM SUSR
INTRAMUSCULAR | 0 refills | Status: DC
  Filled 2022-06-17: qty 1, 1d supply, fill #0

## 2022-07-15 IMAGING — MG MM DIGITAL SCREENING BILAT W/ TOMO AND CAD
8 series · 8 of 24 positions shown · non-contrast
Comparison: Previous exam(s).

CLINICAL DATA: Screening.

EXAM:
DIGITAL SCREENING BILATERAL MAMMOGRAM WITH TOMOSYNTHESIS AND CAD
TECHNIQUE: Bilateral screening digital craniocaudal and mediolateral oblique
mammograms were obtained. Bilateral screening digital breast
tomosynthesis was performed. The images were evaluated with
computer-aided detection.

[R MLO synth-2D]
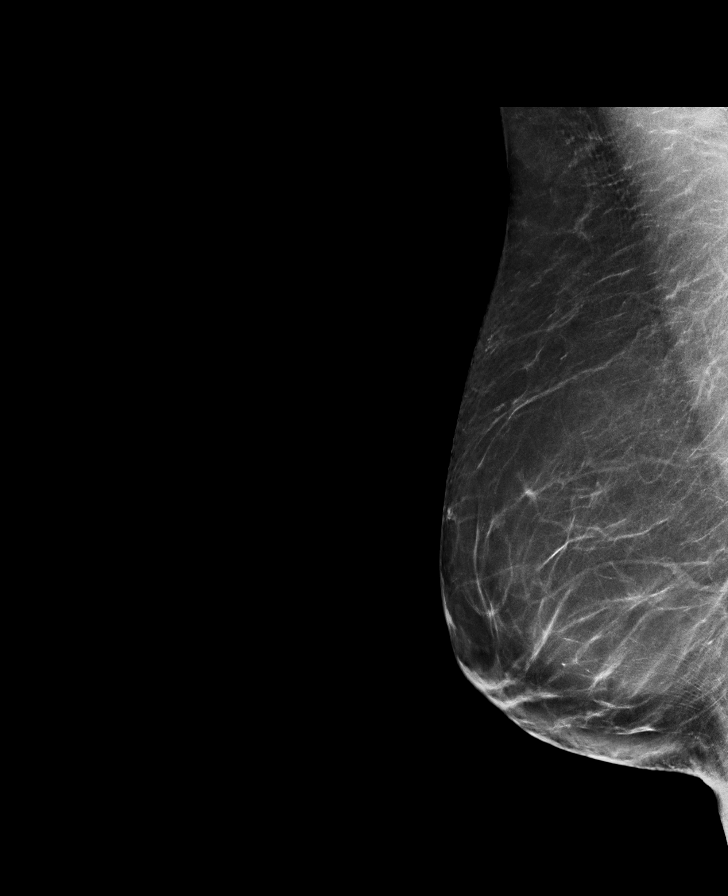

[L CC synth-2D]
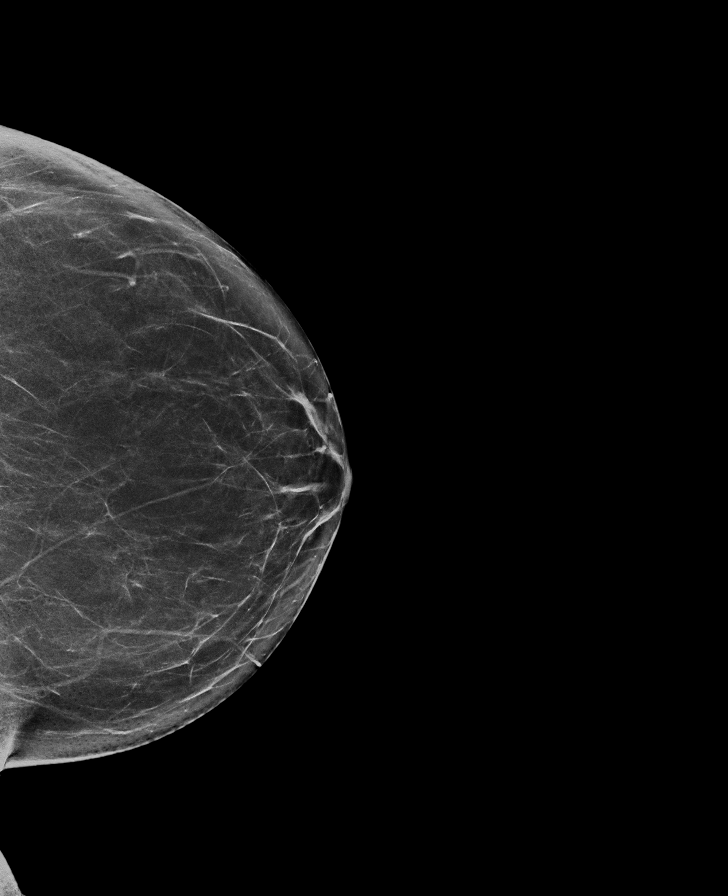

[L MLO synth-2D]
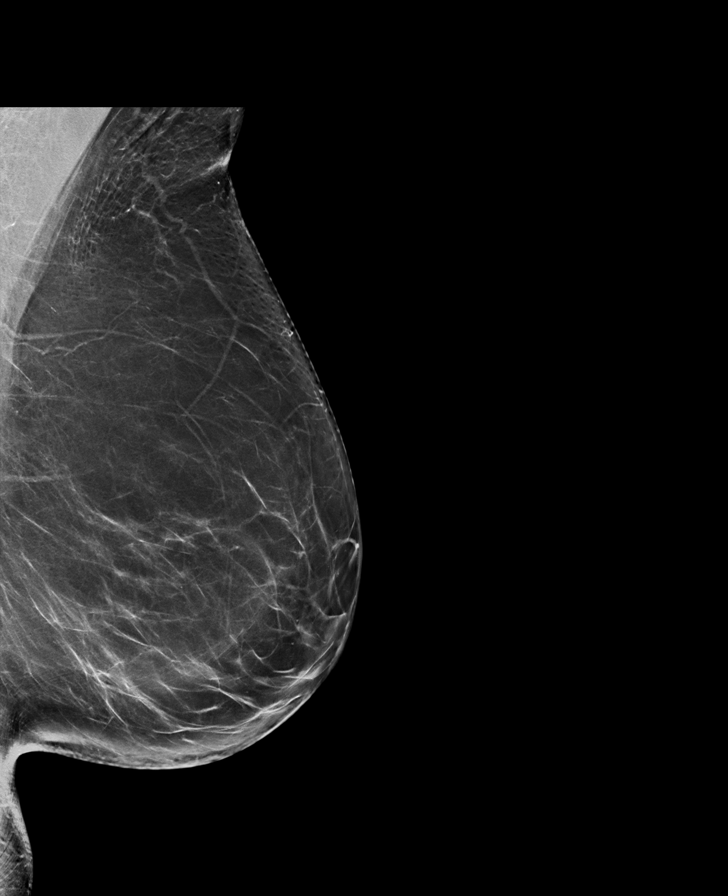

[R CC synth-2D]
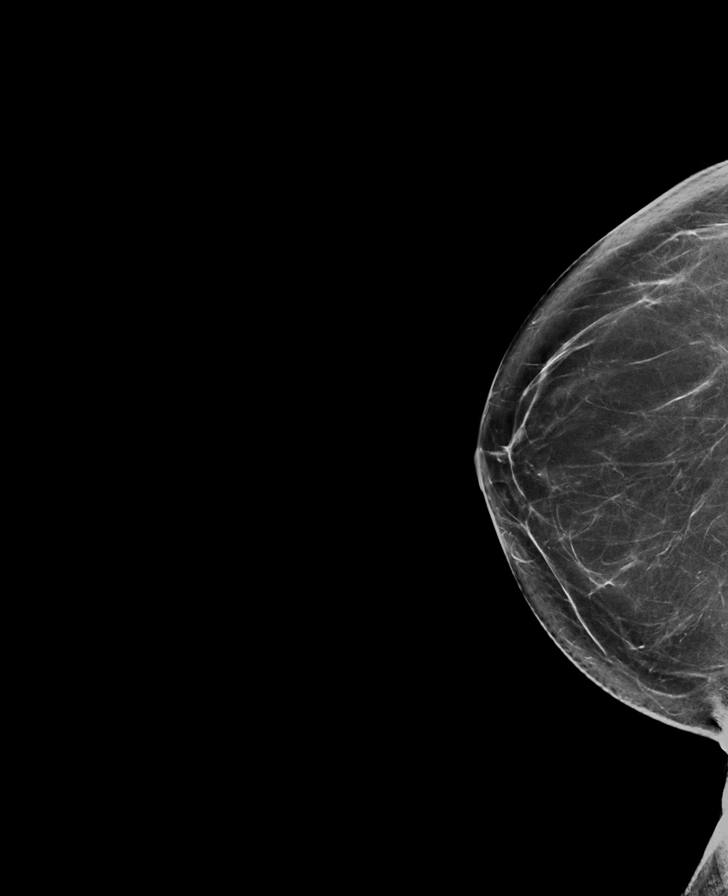

[L MLO tomo · tomo slice 41/80.0]
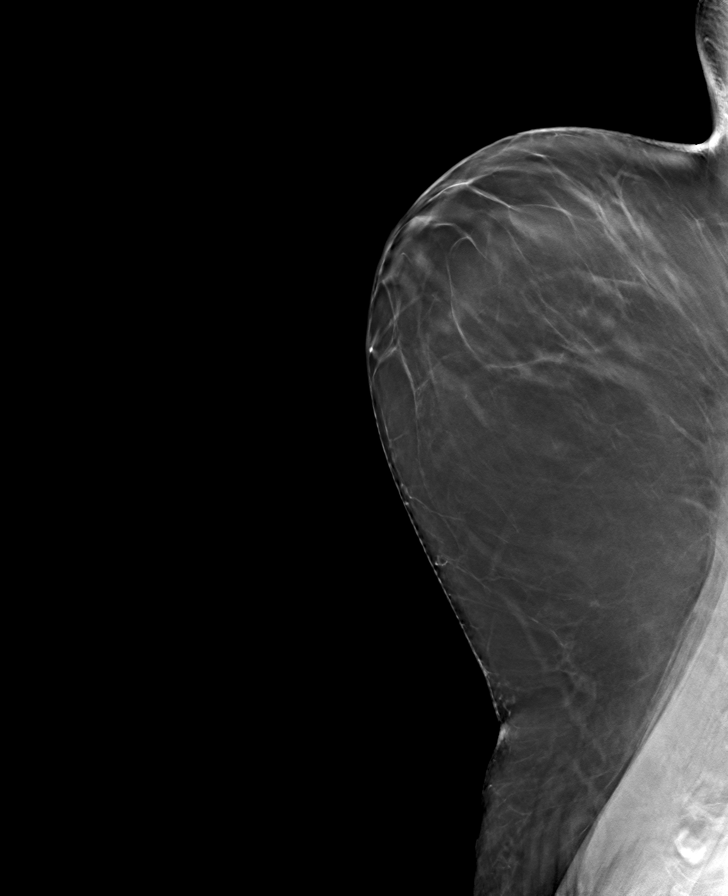

[L CC tomo · tomo slice 35/70.0]
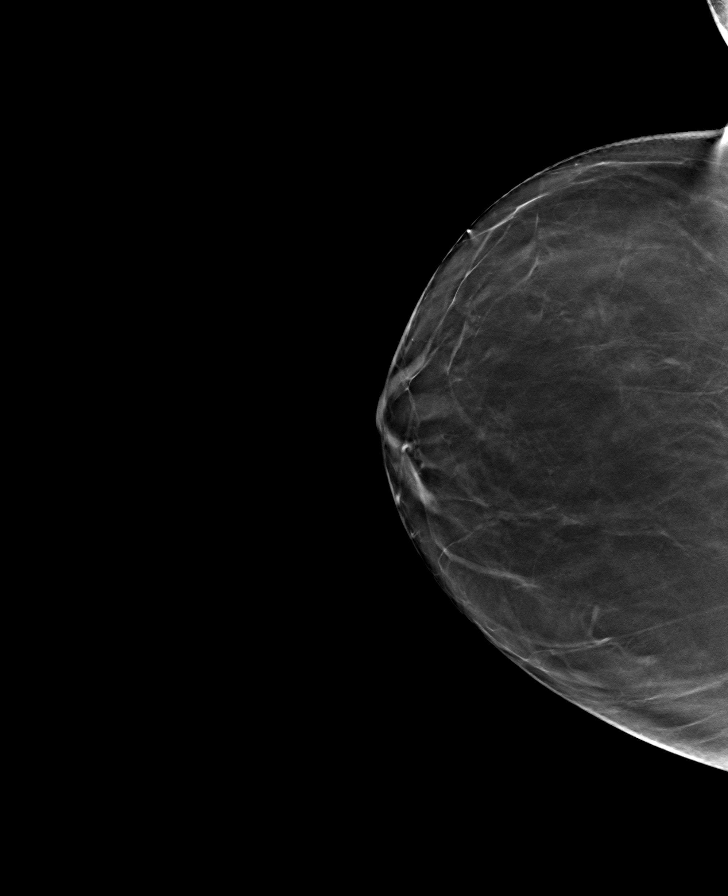

[R CC tomo · tomo slice 39/76.0]
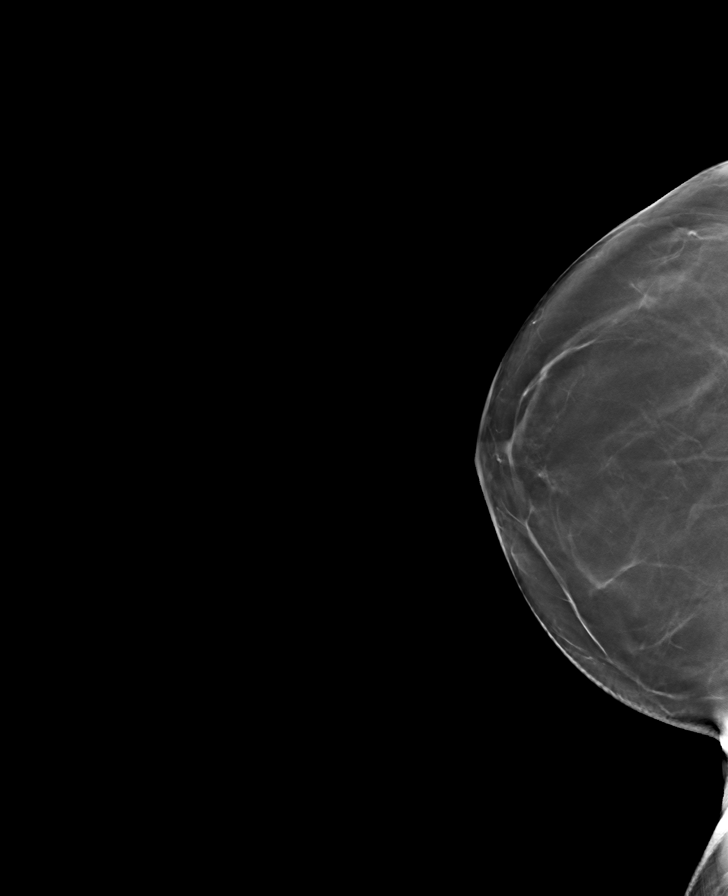

[R MLO tomo · tomo slice 40/79.0]
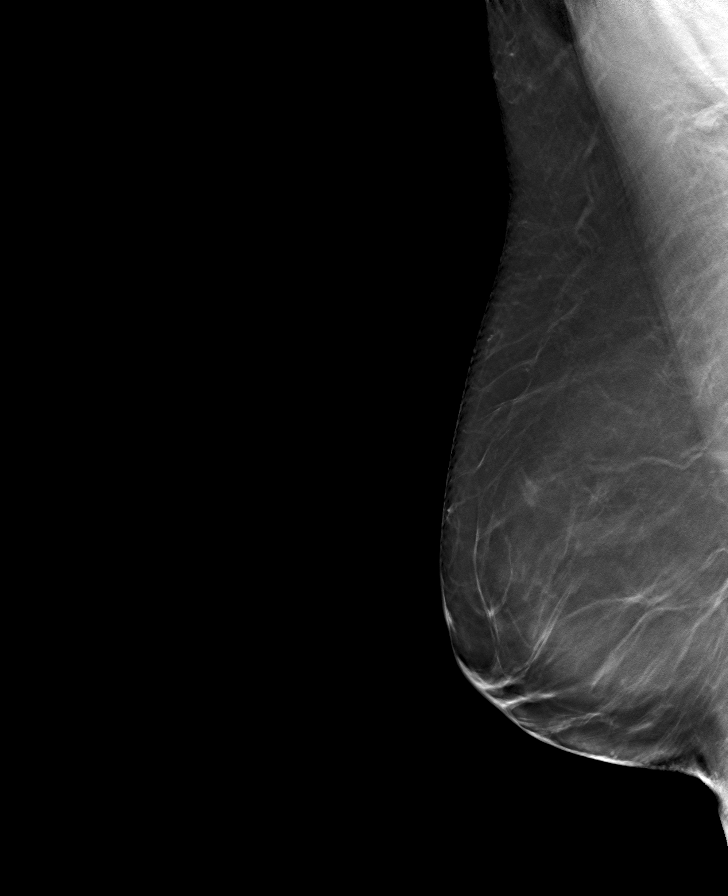

[8 of 24 positions shown; findings below may reference images not displayed]

ACR Breast Density Category b: There are scattered areas of
fibroglandular density.
FINDINGS: There are no findings suspicious for malignancy.
IMPRESSION: No mammographic evidence of malignancy. A result letter of this
screening mammogram will be mailed directly to the patient.

RECOMMENDATION:
Screening mammogram in one year. (Code:51-O-LD2)

BI-RADS CATEGORY  1: Negative.

## 2022-08-08 ENCOUNTER — Other Ambulatory Visit: Payer: Self-pay | Admitting: Family Medicine

## 2022-08-31 ENCOUNTER — Other Ambulatory Visit (HOSPITAL_BASED_OUTPATIENT_CLINIC_OR_DEPARTMENT_OTHER): Payer: Self-pay

## 2022-08-31 DIAGNOSIS — Z23 Encounter for immunization: Secondary | ICD-10-CM | POA: Diagnosis not present

## 2022-08-31 MED ORDER — FLUAD QUADRIVALENT 0.5 ML IM PRSY
PREFILLED_SYRINGE | INTRAMUSCULAR | 0 refills | Status: DC
  Filled 2022-08-31: qty 0.5, 1d supply, fill #0

## 2022-10-07 ENCOUNTER — Other Ambulatory Visit (HOSPITAL_BASED_OUTPATIENT_CLINIC_OR_DEPARTMENT_OTHER): Payer: Self-pay

## 2022-10-07 DIAGNOSIS — Z23 Encounter for immunization: Secondary | ICD-10-CM | POA: Diagnosis not present

## 2022-10-07 MED ORDER — COMIRNATY 30 MCG/0.3ML IM SUSY
PREFILLED_SYRINGE | INTRAMUSCULAR | 0 refills | Status: DC
  Filled 2022-10-07: qty 0.3, 1d supply, fill #0

## 2022-11-04 ENCOUNTER — Other Ambulatory Visit: Payer: Self-pay | Admitting: Family Medicine

## 2022-11-15 ENCOUNTER — Ambulatory Visit: Payer: Medicare Other | Admitting: Family Medicine

## 2022-12-07 NOTE — Progress Notes (Signed)
This encounter was created in error - please disregard.

## 2022-12-23 ENCOUNTER — Ambulatory Visit (INDEPENDENT_AMBULATORY_CARE_PROVIDER_SITE_OTHER): Payer: Medicare Other | Admitting: Family Medicine

## 2022-12-23 ENCOUNTER — Encounter: Payer: Self-pay | Admitting: Family Medicine

## 2022-12-23 VITALS — BP 133/75 | HR 86 | Temp 98.4°F | Ht 65.5 in | Wt 174.0 lb

## 2022-12-23 DIAGNOSIS — R7303 Prediabetes: Secondary | ICD-10-CM | POA: Diagnosis not present

## 2022-12-23 DIAGNOSIS — E559 Vitamin D deficiency, unspecified: Secondary | ICD-10-CM

## 2022-12-23 DIAGNOSIS — M549 Dorsalgia, unspecified: Secondary | ICD-10-CM | POA: Diagnosis not present

## 2022-12-23 DIAGNOSIS — I10 Essential (primary) hypertension: Secondary | ICD-10-CM | POA: Diagnosis not present

## 2022-12-23 DIAGNOSIS — G8929 Other chronic pain: Secondary | ICD-10-CM | POA: Diagnosis not present

## 2022-12-23 DIAGNOSIS — Z1231 Encounter for screening mammogram for malignant neoplasm of breast: Secondary | ICD-10-CM

## 2022-12-23 NOTE — Patient Instructions (Signed)
It was very nice to see you today!  We will order your mammogram today.  Your blood pressure looks good today.  Please let us know if you need referral for your massage.  We will see you back in about 6 months.  Please come back sooner if needed.  Take care, Dr Jerline Pain  PLEASE NOTE:  If you had any lab tests, please let us know if you have not heard back within a few days. You may see your results on mychart before we have a chance to review them but we will give you a call once they are reviewed by [place].   If we ordered any referrals today, please let us know if you have not heard from their office within the next week.   If you had any urgent prescriptions sent in today, please check with the pharmacy within an hour of our visit to make sure the prescription was transmitted appropriately.   Please try these tips to maintain a healthy lifestyle:  Eat at least 3 REAL meals and 1-2 snacks per day.  Aim for no more than 5 hours between eating.  If you eat breakfast, please do so within one hour of getting up.   Each meal should contain half fruits/vegetables, one quarter protein, and one quarter carbs (no bigger than a computer mouse)  Cut down on sweet beverages. This includes juice, soda, and sweet tea.   Drink at least 1 glass of water with each meal and aim for at least 8 glasses per day  Exercise at least 150 minutes every week.

## 2022-12-23 NOTE — Assessment & Plan Note (Signed)
Vitamin D at goal on last blood draw.  She will continue supplementation and we can recheck in 6 months.

## 2022-12-23 NOTE — Assessment & Plan Note (Signed)
Symptoms are overall stable.  She is looking into getting massage therapy and will check with a few different places.  Will place referral if needed.

## 2022-12-23 NOTE — Assessment & Plan Note (Signed)
Doing well with lifestyle modifications.  Check A1c next blood draw.

## 2022-12-23 NOTE — Progress Notes (Signed)
   Eleah Lahaie is a 72 y.o. female who presents today for an office visit.  Assessment/Plan:  New/Acute Problems: Tinnitus No red flags.  Potentially could be positional in nature given that it only happens when she is laying in a certain position.  Does not have any hearing loss.  No other red flag signs or symptoms.  Will continue with watchful waiting.  If this continues to be an issue would consider referral to audiology.  Chronic Problems Addressed Today: HTN (hypertension), benign Blood pressure at goal today on amlodipine 10 mg daily.  Continue home monitoring.  Will recheck in 6 months.  Chronic upper back pain Symptoms are overall stable.  She is looking into getting massage therapy and will check with a few different places.  Will place referral if needed.  Vitamin D deficiency Vitamin D at goal on last blood draw.  She will continue supplementation and we can recheck in 6 months.  Prediabetes Doing well with lifestyle modifications.  Check A1c next blood draw.  Preventative Healthcare Mammogram ordered.     Subjective:  HPI:  See A/P for status of chronic conditions.  Patient is here today for follow-up.  We last saw her about 6 months ago.  At that time she was doing well.  Blood pressure was well-controlled.  We checked labs which were all stable.  She has done well since her last visit.  Home blood pressure readings have been at goal.  She will be getting mammogram done soon and needs a referral for this.  She has had ongoing issues with pain in her neck and occasionally gets a buzzing sensation associated with this.  Usually only happens when lying down.  She has seen sports medicine and physical therapy for this in the past but is interested in massage therapy.  No reported weakness or numbness.  No reported vision changes.  No hearing loss.       Objective:  Physical Exam: BP 133/75   Pulse 86   Temp 98.4 F (36.9 C) (Temporal)   Ht 5' 5.5" (1.664 m)   Wt  174 lb (78.9 kg)   LMP  (LMP Unknown)   SpO2 96%   BMI 28.51 kg/m   Gen: No acute distress, resting comfortably HEENT: TMs clear. CV: Regular rate and rhythm with no murmurs appreciated Pulm: Normal work of breathing, clear to auscultation bilaterally with no crackles, wheezes, or rhonchi Neuro: Cranial nerves intact.  Moves all extremities.  Sensation to light touch intact throughout.   Psych: Normal affect and thought content      Spencer Peterkin M. Jerline Pain, MD 12/23/2022 10:34 AM

## 2022-12-23 NOTE — Assessment & Plan Note (Signed)
Blood pressure at goal today on amlodipine 10 mg daily.  Continue home monitoring.  Will recheck in 6 months.

## 2023-02-04 ENCOUNTER — Other Ambulatory Visit: Payer: Self-pay | Admitting: Family Medicine

## 2023-02-07 ENCOUNTER — Ambulatory Visit (HOSPITAL_BASED_OUTPATIENT_CLINIC_OR_DEPARTMENT_OTHER): Payer: Medicare Other

## 2023-02-14 ENCOUNTER — Encounter (HOSPITAL_BASED_OUTPATIENT_CLINIC_OR_DEPARTMENT_OTHER): Payer: Self-pay

## 2023-02-14 ENCOUNTER — Ambulatory Visit (HOSPITAL_BASED_OUTPATIENT_CLINIC_OR_DEPARTMENT_OTHER)
Admission: RE | Admit: 2023-02-14 | Discharge: 2023-02-14 | Disposition: A | Discharge status: Home / Self Care | Payer: Medicare Other | Source: Ambulatory Visit | Attending: Family Medicine | Admitting: Family Medicine

## 2023-02-14 DIAGNOSIS — Z1231 Encounter for screening mammogram for malignant neoplasm of breast: Secondary | ICD-10-CM | POA: Insufficient documentation

## 2023-04-27 ENCOUNTER — Ambulatory Visit (INDEPENDENT_AMBULATORY_CARE_PROVIDER_SITE_OTHER): Payer: Medicare Other

## 2023-04-27 VITALS — Wt 174.0 lb

## 2023-04-27 DIAGNOSIS — Z0000 Encounter for general adult medical examination without abnormal findings: Secondary | ICD-10-CM | POA: Diagnosis not present

## 2023-04-27 NOTE — Progress Notes (Signed)
I connected with  Joice Lofts on 04/27/23 by a audio enabled telemedicine application and verified that I am speaking with the correct person using two identifiers.  Patient Location: Home  Provider Location: Office/Clinic  I discussed the limitations of evaluation and management by telemedicine. The patient expressed understanding and agreed to proceed.   Subjective:   Nicole Long is a 72 y.o. female who presents for Medicare Annual (Subsequent) preventive examination.  Review of Systems     Cardiac Risk Factors include: advanced age (>95men, >39 women);hypertension     Objective:    Today's Vitals   04/27/23 1313  Weight: 174 lb (78.9 kg)   Body mass index is 28.51 kg/m.     04/27/2023    1:17 PM 04/14/2022   10:38 AM 10/12/2020   11:57 AM 09/04/2019   11:48 AM 11/28/2017    9:17 AM  Advanced Directives  Does Patient Have a Medical Advance Directive? No No Yes No No  Does patient want to make changes to medical advance directive?   Yes (MAU/Ambulatory/Procedural Areas - Information given)    Would patient like information on creating a medical advance directive? No - Patient declined No - Patient declined  Yes (MAU/Ambulatory/Procedural Areas - Information given) Yes (MAU/Ambulatory/Procedural Areas - Information given)    Current Medications (verified) Outpatient Encounter Medications as of 04/27/2023  Medication Sig   amLODipine (NORVASC) 10 MG tablet TAKE 1 TABLET BY MOUTH EVERY DAY   Ascorbic Acid (VITAMIN C PO) Take 1 tablet by mouth daily.   cholecalciferol (VITAMIN D3) 25 MCG (1000 UNIT) tablet Take 1,000 Units by mouth daily.   hydrocortisone 2.5 % cream Apply topically 2 (two) times daily.   Multiple Vitamin (MULTIVITAMIN) tablet Take 2 tablets by mouth daily.    Turmeric (QC TUMERIC COMPLEX) 500 MG CAPS Take by mouth.   valACYclovir (VALTREX) 1000 MG tablet 2 tabs po q12h x 2 doses as needed for fever blisters/cold sores   Zinc 30 MG TABS Take by mouth 3  (three) times a week.   No facility-administered encounter medications on file as of 04/27/2023.    Allergies (verified) Hydrochlorothiazide   History: Past Medical History:  Diagnosis Date   Abnormal EKG    Inverted T waves in precordial leads: unchanged since 2006--this is her baseline   Bicipital tendinitis of right shoulder 02/2017   PT   Eczema    History of vitamin D deficiency    Normal range with high dose replacement x 12 wks Jan 2019.   Hx of adenomatous colonic polyps    Hyperlipidemia 09/2016   IFG (impaired fasting glucose) 09/2016   HbA1c 5.9%   Lipoma    Postmenopausal    White coat syndrome without diagnosis of hypertension    Past Surgical History:  Procedure Laterality Date   BUNIONECTOMY  2010 and 2011   COLONOSCOPY  11/18/2010,12/24/2013   Hx of adenomatous colon polyps.  Several colonoscopies in [place].  Most recent 12/24/13: no polyps.  Recall 5 yrs (Dr. Lanae Boast, [facility]).   DEXA  09/27/2016   NORMAL.  Repeat 2 yrs.   GANGLION CYST EXCISION  1988   LIPOMA EXCISION  1990   POLYPECTOMY     TRANSTHORACIC ECHOCARDIOGRAM  07/04/14   Normal LV size and wall thickness, EF normal, grade I diast dysfxn   TUBAL LIGATION     Family History  Problem Relation Age of Onset   Congestive Heart Failure Mother    Stroke Sister  passed away 5 /2015   Breast cancer Sister    Cancer Brother    Thyroid disease Son    Breast cancer Sister    Stroke Sister    Kidney disease Sister    Kidney disease Brother    Heart disease Brother        pacemaker   Colon cancer Neg Hx    Esophageal cancer Neg Hx    Rectal cancer Neg Hx    Stomach cancer Neg Hx    Colon polyps Neg Hx    Social History   Socioeconomic History   Marital status: Married    Spouse name: Not on file   Number of children: Not on file   Years of education: Not on file   Highest education level: Not on file  Occupational History   Occupation: Retired  Tobacco Use   Smoking  status: Former    Types: Cigarettes    Quit date: 09/11/1985    Years since quitting: 37.6   Smokeless tobacco: Never  Vaping Use   Vaping Use: Never used  Substance and Sexual Activity   Alcohol use: Yes    Comment: socially--occ wine   Drug use: No   Sexual activity: Not on file  Other Topics Concern   Not on file  Social History Narrative   Relocated to [place] 2010 (has PMD in [place], [place]--Dr. Devota Pace).   Married, has 84 y/o son and one step grandchild.   Retired Veterinary surgeon.   Occ alc, no tob/drugs.   Social Determinants of Health   Financial Resource Strain: Low Risk  (04/27/2023)   Overall Financial Resource Strain (CARDIA)    Difficulty of Paying Living Expenses: Not hard at all  Food Insecurity: No Food Insecurity (04/27/2023)   Hunger Vital Sign    Worried About Running Out of Food in the Last Year: Never true    Ran Out of Food in the Last Year: Never true  Transportation Needs: No Transportation Needs (04/27/2023)   PRAPARE - Administrator, Civil Service (Medical): No    Lack of Transportation (Non-Medical): No  Physical Activity: Sufficiently Active (04/27/2023)   Exercise Vital Sign    Days of Exercise per Week: 3 days    Minutes of Exercise per Session: 50 min  Stress: No Stress Concern Present (04/27/2023)   [employer] of Occupational Health - Occupational Stress Questionnaire    Feeling of Stress : Only a little  Social Connections: Moderately Isolated (04/27/2023)   Social Connection and Isolation Panel [NHANES]    Frequency of Communication with Friends and Family: More than three times a week    Frequency of Social Gatherings with Friends and Family: More than three times a week    Attends Religious Services: Never    Database administrator or Organizations: No    Attends Engineer, structural: Never    Marital Status: Married    Tobacco Counseling Counseling given: Not Answered   Clinical Intake:  Pre-visit preparation  completed: Yes  Pain : No/denies pain     BMI - recorded: 28.51 Nutritional Status: BMI 25 -29 Overweight Nutritional Risks: None Diabetes: No  How often do you need to have someone help you when you read instructions, pamphlets, or other written materials from your doctor or pharmacy?: 1 - Never  Diabetic?no  Interpreter Needed?: No  Information entered by :: Lanier Ensign, LPN   Activities of Daily Living    04/27/2023    1:18 PM  In your present state of health, do you have any difficulty performing the following activities:  Hearing? 0  Vision? 0  Difficulty concentrating or making decisions? 0  Walking or climbing stairs? 0  Dressing or bathing? 0  Doing errands, shopping? 0  Preparing Food and eating ? N  Using the Toilet? N  In the past six months, have you accidently leaked urine? N  Do you have problems with loss of bowel control? N  Managing your Medications? N  Managing your Finances? N  Housekeeping or managing your Housekeeping? N    Patient Care Team: Ardith Dark, MD as PCP - General (Family Medicine) Pricilla Riffle, MD as Consulting Physician (Cardiology) Sydnee Cabal., MD as Consulting Physician (Gastroenterology) Lynann Bologna, MD as Consulting Physician (Gastroenterology)  Indicate any recent Medical Services you may have received from other than [facility] providers in the past year (date may be approximate).     Assessment:   This is a routine wellness examination for Tansy.  Hearing/Vision screen Hearing Screening - Comments:: Pt denies any hearing issues  Vision Screening - Comments:: Pt follows up with Dr Jimmey Ralph for annual eye exams  Dietary issues and exercise activities discussed: Current Exercise Habits: Home exercise routine, Type of exercise: walking, Time (Minutes): 50, Frequency (Times/Week): 3, Weekly Exercise (Minutes/Week): 150   Goals Addressed             This Visit's Progress    Patient Stated       Pt continue  to stay healthy and exercise        Depression Screen    04/27/2023    1:15 PM 12/23/2022    9:55 AM 05/17/2022   11:05 AM 04/14/2022   10:36 AM 11/12/2021    8:55 AM 03/31/2021    9:59 AM 10/12/2020   11:53 AM  PHQ 2/9 Scores  PHQ - 2 Score 0 0 0 0 0 0 0    Fall Risk    04/27/2023    1:18 PM 12/23/2022    9:55 AM 05/17/2022   11:05 AM 04/14/2022   10:39 AM 11/12/2021    8:55 AM  Fall Risk   Falls in the past year? 0 0 0 0 0  Number falls in past yr: 0 0 0 0 0  Injury with Fall? 0 0 0 0 0  Risk for fall due to : Impaired vision No Fall Risks No Fall Risks Impaired vision   Follow up Falls prevention discussed   Falls prevention discussed     FALL RISK PREVENTION PERTAINING TO THE HOME:  Any stairs in or around the home? Yes  If so, are there any without handrails? No  Home free of loose throw rugs in walkways, pet beds, electrical cords, etc? Yes  Adequate lighting in your home to reduce risk of falls? Yes   ASSISTIVE DEVICES UTILIZED TO PREVENT FALLS:  Life alert? No  Use of a cane, walker or w/c? No  Grab bars in the bathroom? Yes  Shower chair or bench in shower? Yes  Elevated toilet seat or a handicapped toilet? Yes   TIMED UP AND GO:  Was the test performed? Yes .    Cognitive Function:        04/27/2023    1:19 PM 04/14/2022   10:42 AM 09/04/2019   11:50 AM  6CIT Screen  What Year? 0 points 0 points 0 points  What month? 0 points 0 points 0 points  What time? 0 points  0 points 0 points  Count back from 20 0 points 0 points 0 points  Months in reverse 0 points 0 points 0 points  Repeat phrase 0 points 0 points 0 points  Total Score 0 points 0 points 0 points    Immunizations Immunization History  Administered Date(s) Administered   COVID-19, mRNA, vaccine(Comirnaty)12 years and older 10/07/2022   Fluad Quad(high Dose 65+) 09/04/2019, 09/24/2020, 09/30/2021, 08/31/2022   Influenza Split 09/10/2008, 10/06/2009   Influenza, High Dose Seasonal PF 09/13/2016,  10/04/2017, 10/30/2018   Influenza, Seasonal, Injecte, Preservative Fre 09/24/2014   Influenza,inj,Quad PF,6+ Mos 09/11/2013   Influenza,inj,quad, With Preservative 12/09/2015   PFIZER Comirnaty(Gray Top)Covid-19 Tri-Sucrose Vaccine 04/30/2021   PFIZER(Purple Top)SARS-COV-2 Vaccination 01/16/2020, 02/10/2020, 09/26/2020   Pfizer Covid-19 Vaccine Bivalent Booster 68yrs & up 09/09/2021   Pneumococcal Conjugate-13 04/19/2018   Pneumococcal Polysaccharide-23 09/04/2019   Tdap 09/11/2013   Varicella 03/06/2014   Zoster Recombinat (Shingrix) 03/15/2022, 06/17/2022    TDAP status: Up to date  Flu Vaccine status: Up to date  Pneumococcal vaccine status: Up to date  Covid-19 vaccine status: Completed vaccines  Qualifies for Shingles Vaccine? Yes   Zostavax completed Yes   Shingrix Completed?: Yes  Screening Tests Health Maintenance  Topic Date Due   INFLUENZA VACCINE  07/13/2023   DTaP/Tdap/Td (2 - Td or Tdap) 09/12/2023   Medicare Annual Wellness (AWV)  04/26/2024   MAMMOGRAM  02/13/2025   COLONOSCOPY (Pts 45-34yrs Insurance coverage will need to be confirmed)  10/24/2026   Pneumonia Vaccine 67+ Years old  Completed   DEXA SCAN  Completed   COVID-19 Vaccine  Completed   Hepatitis C Screening  Completed   Zoster Vaccines- Shingrix  Completed   HPV VACCINES  Aged Out    Health Maintenance  There are no preventive care reminders to display for this patient.   Colorectal cancer screening: Type of screening: Colonoscopy. Completed 10/25/19. Repeat every 7 years  Mammogram status: Completed 02/14/23. Repeat every year  Bone Density status: Completed 09/27/16. Results reflect: Bone density results: NORMAL. Repeat every 2 years.    Additional Screening:  Hepatitis C Screening:  Completed 11/22/18  Vision Screening: Recommended annual ophthalmology exams for early detection of glaucoma and other disorders of the eye. Is the patient up to date with their annual eye exam?  Yes   Who is the provider or what is the name of the office in which the patient attends annual eye exams? Dr Jimmey Ralph  If pt is not established with a provider, would they like to be referred to a provider to establish care? No .   Dental Screening: Recommended annual dental exams for proper oral hygiene  Community Resource Referral / Chronic Care Management: CRR required this visit?  No   CCM required this visit?  No      Plan:     I have personally reviewed and noted the following in the patient's chart:   Medical and social history Use of alcohol, tobacco or illicit drugs  Current medications and supplements including opioid prescriptions. Patient is not currently taking opioid prescriptions. Functional ability and status Nutritional status Physical activity Advanced directives List of other physicians Hospitalizations, surgeries, and ER visits in previous 12 months Vitals Screenings to include cognitive, depression, and falls Referrals and appointments  In addition, I have reviewed and discussed with patient certain preventive protocols, quality metrics, and best practice recommendations. A written personalized care plan for preventive services as well as general preventive health recommendations were provided to patient.  Marzella Schlein, LPN   [id number]/61/0960   Nurse Notes: none

## 2023-04-27 NOTE — Patient Instructions (Signed)
Nicole Long , Thank you for taking time to come for your Medicare Wellness Visit. I appreciate your ongoing commitment to your health goals. Please review the following plan we discussed and let me know if I can assist you in the future.   These are the goals we discussed:  Goals      Increase physical activity     Increase activity and continue healthy eating.      Patient Stated     Continue to work on exercise and diet     Patient Stated     To get back to walking         This is a list of the screening recommended for you and due dates:  Health Maintenance  Topic Date Due   Flu Shot  07/13/2023   DTaP/Tdap/Td vaccine (2 - Td or Tdap) 09/12/2023   Medicare Annual Wellness Visit  04/26/2024   Mammogram  02/13/2025   Colon Cancer Screening  10/24/2026   Pneumonia Vaccine  Completed   DEXA scan (bone density measurement)  Completed   COVID-19 Vaccine  Completed   Hepatitis C Screening: USPSTF Recommendation to screen - Ages 36-79 yo.  Completed   Zoster (Shingles) Vaccine  Completed   HPV Vaccine  Aged Out    Advanced directives: Advance directive discussed with you today. Even though you declined this today please call our office should you change your mind and we can give you the proper paperwork for you to fill out.  Conditions/risks identified: Pt continue to stay healthy and exercise   Next appointment: Follow up in one year for your annual wellness visit    Preventive Care 65 Years and Older, Female Preventive care refers to lifestyle choices and visits with your health care provider that can promote health and wellness. What does preventive care include? A yearly physical exam. This is also called an annual well check. Dental exams once or twice a year. Routine eye exams. Ask your health care provider how often you should have your eyes checked. Personal lifestyle choices, including: Daily care of your teeth and gums. Regular physical activity. Eating a healthy  diet. Avoiding tobacco and drug use. Limiting alcohol use. Practicing safe sex. Taking low-dose aspirin every day. Taking vitamin and mineral supplements as recommended by your health care provider. What happens during an annual well check? The services and screenings done by your health care provider during your annual well check will depend on your age, overall health, lifestyle risk factors, and family history of disease. Counseling  Your health care provider may ask you questions about your: Alcohol use. Tobacco use. Drug use. Emotional well-being. Home and relationship well-being. Sexual activity. Eating habits. History of falls. Memory and ability to understand (cognition). Work and work Astronomer. Reproductive health. Screening  You may have the following tests or measurements: Height, weight, and BMI. Blood pressure. Lipid and cholesterol levels. These may be checked every 5 years, or more frequently if you are over 62 years old. Skin check. Lung cancer screening. You may have this screening every year starting at age 69 if you have a 30-pack-year history of smoking and currently smoke or have quit within the past 15 years. Fecal occult blood test (FOBT) of the stool. You may have this test every year starting at age 64. Flexible sigmoidoscopy or colonoscopy. You may have a sigmoidoscopy every 5 years or a colonoscopy every 10 years starting at age 84. Hepatitis C blood test. Hepatitis B blood test. Sexually transmitted  disease (STD) testing. Diabetes screening. This is done by checking your blood sugar (glucose) after you have not eaten for a while (fasting). You may have this done every 1-3 years. Bone density scan. This is done to screen for osteoporosis. You may have this done starting at age 45. Mammogram. This may be done every 1-2 years. Talk to your health care provider about how often you should have regular mammograms. Talk with your health care provider about  your test results, treatment options, and if necessary, the need for more tests. Vaccines  Your health care provider may recommend certain vaccines, such as: Influenza vaccine. This is recommended every year. Tetanus, diphtheria, and acellular pertussis (Tdap, Td) vaccine. You may need a Td booster every 10 years. Zoster vaccine. You may need this after age 37. Pneumococcal 13-valent conjugate (PCV13) vaccine. One dose is recommended after age 40. Pneumococcal polysaccharide (PPSV23) vaccine. One dose is recommended after age 29. Talk to your health care provider about which screenings and vaccines you need and how often you need them. This information is not intended to replace advice given to you by your health care provider. Make sure you discuss any questions you have with your health care provider. Document Released: 12/25/2015 Document Revised: 08/17/2016 Document Reviewed: 09/29/2015 Elsevier Interactive Patient Education  2017 [place] Prevention in the Home Falls can cause injuries. They can happen to people of all ages. There are many things you can do to make your home safe and to help prevent falls. What can I do on the outside of my home? Regularly fix the edges of walkways and driveways and fix any cracks. Remove anything that might make you trip as you walk through a door, such as a raised step or threshold. Trim any bushes or trees on the path to your home. Use bright outdoor lighting. Clear any walking paths of anything that might make someone trip, such as rocks or tools. Regularly check to see if handrails are loose or broken. Make sure that both sides of any steps have handrails. Any raised decks and porches should have guardrails on the edges. Have any leaves, snow, or ice cleared regularly. Use sand or salt on walking paths during winter. Clean up any spills in your garage right away. This includes oil or grease spills. What can I do in the bathroom? Use  night lights. Install grab bars by the toilet and in the tub and shower. Do not use towel bars as grab bars. Use non-skid mats or decals in the tub or shower. If you need to sit down in the shower, use a plastic, non-slip stool. Keep the floor dry. Clean up any water that spills on the floor as soon as it happens. Remove soap buildup in the tub or shower regularly. Attach bath mats securely with double-sided non-slip rug tape. Do not have throw rugs and other things on the floor that can make you trip. What can I do in the bedroom? Use night lights. Make sure that you have a light by your bed that is easy to reach. Do not use any sheets or blankets that are too big for your bed. They should not hang down onto the floor. Have a firm chair that has side arms. You can use this for support while you get dressed. Do not have throw rugs and other things on the floor that can make you trip. What can I do in the kitchen? Clean up any spills right away. Avoid walking  on wet floors. Keep items that you use a lot in easy-to-reach places. If you need to reach something above you, use a strong step stool that has a grab bar. Keep electrical cords out of the way. Do not use floor polish or wax that makes floors slippery. If you must use wax, use non-skid floor wax. Do not have throw rugs and other things on the floor that can make you trip. What can I do with my stairs? Do not leave any items on the stairs. Make sure that there are handrails on both sides of the stairs and use them. Fix handrails that are broken or loose. Make sure that handrails are as long as the stairways. Check any carpeting to make sure that it is firmly attached to the stairs. Fix any carpet that is loose or worn. Avoid having throw rugs at the top or bottom of the stairs. If you do have throw rugs, attach them to the floor with carpet tape. Make sure that you have a light switch at the top of the stairs and the bottom of the  stairs. If you do not have them, ask someone to add them for you. What else can I do to help prevent falls? Wear shoes that: Do not have high heels. Have rubber bottoms. Are comfortable and fit you well. Are closed at the toe. Do not wear sandals. If you use a stepladder: Make sure that it is fully opened. Do not climb a closed stepladder. Make sure that both sides of the stepladder are locked into place. Ask someone to hold it for you, if possible. Clearly mark and make sure that you can see: Any grab bars or handrails. First and last steps. Where the edge of each step is. Use tools that help you move around (mobility aids) if they are needed. These include: Canes. Walkers. Scooters. Crutches. Turn on the lights when you go into a dark area. Replace any light bulbs as soon as they burn out. Set up your furniture so you have a clear path. Avoid moving your furniture around. If any of your floors are uneven, fix them. If there are any pets around you, be aware of where they are. Review your medicines with your doctor. Some medicines can make you feel dizzy. This can increase your chance of falling. Ask your doctor what other things that you can do to help prevent falls. This information is not intended to replace advice given to you by your health care provider. Make sure you discuss any questions you have with your health care provider. Document Released: 09/24/2009 Document Revised: 05/05/2016 Document Reviewed: 01/02/2015 Elsevier Interactive Patient Education  2017 [employer].

## 2023-05-03 ENCOUNTER — Other Ambulatory Visit: Payer: Self-pay | Admitting: Family Medicine

## 2023-06-20 ENCOUNTER — Ambulatory Visit: Payer: Medicare Other | Admitting: Family Medicine

## 2023-06-27 ENCOUNTER — Ambulatory Visit: Payer: Medicare Other | Admitting: Family Medicine

## 2023-07-25 ENCOUNTER — Ambulatory Visit (INDEPENDENT_AMBULATORY_CARE_PROVIDER_SITE_OTHER): Payer: Medicare Other | Admitting: Family Medicine

## 2023-07-25 ENCOUNTER — Encounter: Payer: Self-pay | Admitting: Family Medicine

## 2023-07-25 VITALS — BP 129/78 | HR 107 | Temp 97.5°F | Ht 65.5 in | Wt 173.6 lb

## 2023-07-25 DIAGNOSIS — I10 Essential (primary) hypertension: Secondary | ICD-10-CM | POA: Diagnosis not present

## 2023-07-25 DIAGNOSIS — E785 Hyperlipidemia, unspecified: Secondary | ICD-10-CM | POA: Diagnosis not present

## 2023-07-25 DIAGNOSIS — E559 Vitamin D deficiency, unspecified: Secondary | ICD-10-CM | POA: Diagnosis not present

## 2023-07-25 DIAGNOSIS — R7303 Prediabetes: Secondary | ICD-10-CM | POA: Diagnosis not present

## 2023-07-25 LAB — VITAMIN D 25 HYDROXY (VIT D DEFICIENCY, FRACTURES): VITD: 70.59 ng/mL (ref 30.00–100.00)

## 2023-07-25 LAB — CBC
HCT: 40 % (ref 36.0–46.0)
Hemoglobin: 13 g/dL (ref 12.0–15.0)
MCHC: 32.6 g/dL (ref 30.0–36.0)
MCV: 93.1 fl (ref 78.0–100.0)
Platelets: 272 10*3/uL (ref 150.0–400.0)
RBC: 4.29 Mil/uL (ref 3.87–5.11)
RDW: 13.9 % (ref 11.5–15.5)
WBC: 5 10*3/uL (ref 4.0–10.5)

## 2023-07-25 LAB — COMPREHENSIVE METABOLIC PANEL
ALT: 12 U/L (ref 0–35)
AST: 21 U/L (ref 0–37)
Albumin: 4.5 g/dL (ref 3.5–5.2)
Alkaline Phosphatase: 82 U/L (ref 39–117)
BUN: 9 mg/dL (ref 6–23)
CO2: 26 mEq/L (ref 19–32)
Calcium: 10 mg/dL (ref 8.4–10.5)
Chloride: 102 mEq/L (ref 96–112)
Creatinine, Ser: 0.79 mg/dL (ref 0.40–1.20)
GFR: 74.75 mL/min (ref >60.00)
Glucose, Bld: 101 mg/dL — ABNORMAL HIGH (ref 70–99)
Potassium: 4.1 mEq/L (ref 3.5–5.1)
Sodium: 137 mEq/L (ref 135–145)
Total Bilirubin: 0.8 mg/dL (ref 0.2–1.2)
Total Protein: 7.9 g/dL (ref 6.0–8.3)

## 2023-07-25 LAB — LIPID PANEL
Cholesterol: 213 mg/dL — ABNORMAL HIGH (ref 0–200)
HDL: 62.2 mg/dL (ref >39.00)
LDL Cholesterol: 137 mg/dL — ABNORMAL HIGH (ref 0–99)
NonHDL: 150.89
Total CHOL/HDL Ratio: 3
Triglycerides: 67 mg/dL (ref 0.0–149.0)
VLDL: 13.4 mg/dL (ref 0.0–40.0)

## 2023-07-25 LAB — TSH: TSH: 1.76 u[IU]/mL (ref 0.35–5.50)

## 2023-07-25 LAB — HEMOGLOBIN A1C: Hgb A1c MFr Bld: 5.8 % (ref 4.6–6.5)

## 2023-07-25 NOTE — Patient Instructions (Signed)
It was very nice to see you today!  Please keep an eye on your blood pressure and let us know if it is persistently elevated.  Will check blood work today.  Please keep up the great with your diet and exercise.  Return in about 6 months (around 01/25/2024).   Take care, Dr Jimmey Ralph  PLEASE NOTE:  If you had any lab tests, please let us know if you have not heard back within a few days. You may see your results on mychart before we have a chance to review them but we will give you a call once they are reviewed by [place].   If we ordered any referrals today, please let us know if you have not heard from their office within the next week.   If you had any urgent prescriptions sent in today, please check with the pharmacy within an hour of our visit to make sure the prescription was transmitted appropriately.   Please try these tips to maintain a healthy lifestyle:  Eat at least 3 REAL meals and 1-2 snacks per day.  Aim for no more than 5 hours between eating.  If you eat breakfast, please do so within one hour of getting up.   Each meal should contain half fruits/vegetables, one quarter protein, and one quarter carbs (no bigger than a computer mouse)  Cut down on sweet beverages. This includes juice, soda, and sweet tea.   Drink at least 1 glass of water with each meal and aim for at least 8 glasses per day  Exercise at least 150 minutes every week.

## 2023-07-25 NOTE — Assessment & Plan Note (Signed)
Check lipids. Discussed lifestyle modifications.  

## 2023-07-25 NOTE — Assessment & Plan Note (Signed)
Check vitamin D. 

## 2023-07-25 NOTE — Assessment & Plan Note (Addendum)
Initially elevated today however at goal on recheck.  Continue amlodipine 10 mg daily.  She will monitor at home and let us know if persistently elevated.

## 2023-07-25 NOTE — Assessment & Plan Note (Signed)
Check A1c. 

## 2023-07-25 NOTE — Progress Notes (Signed)
   Nicole Long is a 72 y.o. female who presents today for an office visit.  Assessment/Plan:  Chronic Problems Addressed Today: HTN (hypertension), benign Initially elevated today however at goal on recheck.  Continue amlodipine 10 mg daily.  She will monitor at home and let us know if persistently elevated.  Vitamin D deficiency Check vitamin D.  Prediabetes Check A1c.  Dyslipidemia Check lipids. Discussed lifestyle modifications.   Preventative health care Up-to-date on cancer screening.  She will be getting RSV and flu vaccine this fall.  Check labs today.     Subjective:  HPI:  See Assessment / plan for status of chronic conditions.  She has no acute concerns today.        Objective:  Physical Exam: BP (!) 143/82   Pulse (!) 107   Temp (!) 97.5 F (36.4 C) (Temporal)   Ht 5' 5.5" (1.664 m)   Wt 173 lb 9.6 oz (78.7 kg)   LMP  (LMP Unknown)   SpO2 100%   BMI 28.45 kg/m   Gen: No acute distress, resting comfortably CV: Regular rate and rhythm with no murmurs appreciated Pulm: Normal work of breathing, clear to auscultation bilaterally with no crackles, wheezes, or rhonchi Neuro: Grossly normal, moves all extremities Psych: Normal affect and thought content      Nicole Long M. Jimmey Ralph, MD 07/25/2023 11:04 AM

## 2023-07-26 NOTE — Progress Notes (Signed)
Cholesterol and blood sugar are borderline elevated.  She may benefit from starting low-dose statin to improve her cholesterol numbers and lower risk of heart attack and stroke.  Please send in Lipitor 20 mg daily she is we will start this.  Regardless she should continue to work on diet and exercise and we can recheck in 6 to 12 months.

## 2023-08-03 ENCOUNTER — Other Ambulatory Visit: Payer: Self-pay | Admitting: Family Medicine

## 2023-08-22 ENCOUNTER — Other Ambulatory Visit (HOSPITAL_BASED_OUTPATIENT_CLINIC_OR_DEPARTMENT_OTHER): Payer: Self-pay

## 2023-08-22 DIAGNOSIS — Z23 Encounter for immunization: Secondary | ICD-10-CM | POA: Diagnosis not present

## 2023-08-22 MED ORDER — INFLUENZA VAC A&B SURF ANT ADJ 0.5 ML IM SUSY
0.5000 mL | PREFILLED_SYRINGE | Freq: Once | INTRAMUSCULAR | 0 refills | Status: AC
  Filled 2023-08-22: qty 0.5, 1d supply, fill #0

## 2023-09-01 ENCOUNTER — Telehealth: Payer: Self-pay | Admitting: Family Medicine

## 2023-09-01 NOTE — Telephone Encounter (Signed)
Patient states she has a follow up Optometry appointment on 09/05/23. Patient requests to be advised if she should not go to the appointment. Patient states she has records records from Optometrist to be sent to Dr. Jimmey Ralph.

## 2023-09-04 NOTE — Telephone Encounter (Signed)
See note

## 2023-09-05 ENCOUNTER — Other Ambulatory Visit (HOSPITAL_BASED_OUTPATIENT_CLINIC_OR_DEPARTMENT_OTHER): Payer: Self-pay

## 2023-09-05 DIAGNOSIS — Z23 Encounter for immunization: Secondary | ICD-10-CM | POA: Diagnosis not present

## 2023-09-05 DIAGNOSIS — H2513 Age-related nuclear cataract, bilateral: Secondary | ICD-10-CM | POA: Diagnosis not present

## 2023-09-05 MED ORDER — COVID-19 MRNA VAC-TRIS(PFIZER) 30 MCG/0.3ML IM SUSY
0.3000 mL | PREFILLED_SYRINGE | Freq: Once | INTRAMUSCULAR | 0 refills | Status: AC
  Filled 2023-09-05: qty 0.3, 1d supply, fill #0

## 2023-09-05 NOTE — Telephone Encounter (Signed)
Recommend that she keep her appointment and let us know if any issues arise that we need to follow up on.  Katina Degree. Jimmey Ralph, MD 09/05/2023 7:42 AM

## 2023-09-05 NOTE — Telephone Encounter (Signed)
Left message to return call to our office at their convenience.  

## 2023-10-29 ENCOUNTER — Other Ambulatory Visit: Payer: Self-pay | Admitting: Family Medicine

## 2024-01-11 ENCOUNTER — Other Ambulatory Visit: Payer: Self-pay | Admitting: Family Medicine

## 2024-01-11 MED ORDER — VALACYCLOVIR HCL 1 G PO TABS: ORAL_TABLET | ORAL | 2 refills | Status: DC

## 2024-01-11 NOTE — Telephone Encounter (Signed)
Last refill on 11/12/2021 #12 with 2 refills

## 2024-01-11 NOTE — Telephone Encounter (Signed)
Copied from CRM [id number]. Topic: Clinical - Medication Refill >> Jan 11, 2024 11:50 AM Marica Otter wrote: Most Recent Primary Care Visit:  Provider: Ardith Dark  Department: [facility]  Visit Type: OFFICE VISIT  Date: 07/25/2023  Medication: valACYclovir (VALTREX) 1000 MG tablet  Has the patient contacted their pharmacy? Yes, need updated prescription (Agent: If no, request that the patient contact the pharmacy for the refill. If patient does not wish to contact the pharmacy document the reason why and proceed with request.) (Agent: If yes, when and what did the pharmacy advise?)  Is this the correct pharmacy for this prescription? Yes If no, delete pharmacy and type the correct one.  This is the patient's preferred pharmacy:  [id number] - [place], [place] - [address] [place] [address] Phone: [phone] Fax: [phone]   Has the prescription been filled recently? No  Is the patient out of the medication? Yes  Has the patient been seen for an appointment in the last year OR does the patient have an upcoming appointment? Yes  Can we respond through MyChart? No  Agent: Please be advised that Rx refills may take up to 3 business days. We ask that you follow-up with your pharmacy.

## 2024-01-16 ENCOUNTER — Other Ambulatory Visit (HOSPITAL_BASED_OUTPATIENT_CLINIC_OR_DEPARTMENT_OTHER): Payer: Self-pay

## 2024-01-16 MED ORDER — RSV PRE-FUSION F A&B VAC RCMB 120 MCG/0.5ML IM SOLR
0.5000 mL | Freq: Once | INTRAMUSCULAR | 0 refills | Status: AC
  Filled 2024-01-16: qty 0.5, 1d supply, fill #0

## 2024-01-27 ENCOUNTER — Other Ambulatory Visit: Payer: Self-pay | Admitting: Family Medicine

## 2024-01-30 ENCOUNTER — Ambulatory Visit: Payer: Medicare Other | Admitting: Family Medicine

## 2024-02-06 ENCOUNTER — Other Ambulatory Visit (HOSPITAL_BASED_OUTPATIENT_CLINIC_OR_DEPARTMENT_OTHER): Payer: Self-pay | Admitting: Family Medicine

## 2024-02-06 DIAGNOSIS — Z1231 Encounter for screening mammogram for malignant neoplasm of breast: Secondary | ICD-10-CM

## 2024-02-08 ENCOUNTER — Ambulatory Visit (INDEPENDENT_AMBULATORY_CARE_PROVIDER_SITE_OTHER): Payer: Medicare Other | Admitting: Family Medicine

## 2024-02-08 ENCOUNTER — Encounter: Payer: Self-pay | Admitting: Family Medicine

## 2024-02-08 VITALS — BP 138/79 | HR 84 | Temp 97.3°F | Ht 65.5 in | Wt 173.2 lb

## 2024-02-08 DIAGNOSIS — E785 Hyperlipidemia, unspecified: Secondary | ICD-10-CM

## 2024-02-08 DIAGNOSIS — I10 Essential (primary) hypertension: Secondary | ICD-10-CM

## 2024-02-08 DIAGNOSIS — B001 Herpesviral vesicular dermatitis: Secondary | ICD-10-CM | POA: Insufficient documentation

## 2024-02-08 MED ORDER — VALACYCLOVIR HCL 1 G PO TABS
ORAL_TABLET | ORAL | 2 refills | Status: AC
Start: 2024-02-08 — End: ?

## 2024-02-08 MED ORDER — HYDROCORTISONE 2.5 % EX CREA: TOPICAL_CREAM | Freq: Two times a day (BID) | CUTANEOUS | 5 refills | Status: AC

## 2024-02-08 NOTE — Patient Instructions (Signed)
 It was very nice to see you today!  We will refill your medications today.  Please keep working on diet and exercise.  I will see back in 6 months for your regular checkup.  We can check blood work at that time.  Return in about 6 months (around 08/07/2024) for Annual Physical.   Take care, Dr Jimmey Ralph  PLEASE NOTE:  If you had any lab tests, please let us know if you have not heard back within a few days. You may see your results on mychart before we have a chance to review them but we will give you a call once they are reviewed by [place].   If we ordered any referrals today, please let us know if you have not heard from their office within the next week.   If you had any urgent prescriptions sent in today, please check with the pharmacy within an hour of our visit to make sure the prescription was transmitted appropriately.   Please try these tips to maintain a healthy lifestyle:  Eat at least 3 REAL meals and 1-2 snacks per day.  Aim for no more than 5 hours between eating.  If you eat breakfast, please do so within one hour of getting up.   Each meal should contain half fruits/vegetables, one quarter protein, and one quarter carbs (no bigger than a computer mouse)  Cut down on sweet beverages. This includes juice, soda, and sweet tea.   Drink at least 1 glass of water with each meal and aim for at least 8 glasses per day  Exercise at least 150 minutes every week.

## 2024-02-08 NOTE — Assessment & Plan Note (Signed)
 Stable on Valtrex as needed.  Will refill today.  She has had a few outbreaks recently however Valtrex typically works well.

## 2024-02-08 NOTE — Progress Notes (Signed)
   Nicole Long is a 73 y.o. female who presents today for an office visit.  Assessment/Plan:  Chronic Problems Addressed Today: HTN (hypertension), benign Blood pressure at goal today on amlodipine 10 mg daily.  Home readings are at goal as well.  Will refill today.  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  Will refill today.  She has had a few outbreaks recently however Valtrex typically works well.  Dyslipidemia She would like to avoid statins. She is working on diet and exercise.  Recheck lipids in 6 months.     Subjective:  HPI:  See Assessment / plan for status of chronic conditions. She is here today with for her 6 month follow up.  She has no acute concerns today.        Objective:  Physical Exam: BP 138/79   Pulse 84   Temp (!) 97.3 F (36.3 C) (Temporal)   Ht 5' 5.5" (1.664 m)   Wt 173 lb 3.2 oz (78.6 kg)   LMP  (LMP Unknown)   SpO2 99%   BMI 28.38 kg/m   Gen: No acute distress, resting comfortably CV: Regular rate and rhythm with no murmurs appreciated Pulm: Normal work of breathing, clear to auscultation bilaterally with no crackles, wheezes, or rhonchi Neuro: Grossly normal, moves all extremities Psych: Normal affect and thought content      Nicole Long M. Jimmey Ralph, MD 02/08/2024 11:16 AM

## 2024-02-08 NOTE — Assessment & Plan Note (Signed)
 Blood pressure at goal today on amlodipine 10 mg daily.  Home readings are at goal as well.  Will refill today.

## 2024-02-08 NOTE — Assessment & Plan Note (Signed)
 She would like to avoid statins. She is working on diet and exercise.  Recheck lipids in 6 months.

## 2024-02-20 ENCOUNTER — Ambulatory Visit (HOSPITAL_BASED_OUTPATIENT_CLINIC_OR_DEPARTMENT_OTHER)
Admission: RE | Admit: 2024-02-20 | Discharge: 2024-02-20 | Disposition: A | Discharge status: Home / Self Care | Payer: Medicare Other | Source: Ambulatory Visit | Attending: Family Medicine | Admitting: Family Medicine

## 2024-02-20 ENCOUNTER — Other Ambulatory Visit (HOSPITAL_BASED_OUTPATIENT_CLINIC_OR_DEPARTMENT_OTHER): Payer: Self-pay

## 2024-02-20 ENCOUNTER — Encounter (HOSPITAL_BASED_OUTPATIENT_CLINIC_OR_DEPARTMENT_OTHER): Payer: Self-pay

## 2024-02-20 DIAGNOSIS — Z1231 Encounter for screening mammogram for malignant neoplasm of breast: Secondary | ICD-10-CM | POA: Diagnosis not present

## 2024-02-20 MED ORDER — BOOSTRIX 5-2.5-18.5 LF-MCG/0.5 IM SUSY
0.5000 mL | PREFILLED_SYRINGE | Freq: Once | INTRAMUSCULAR | 0 refills | Status: AC
  Filled 2024-02-20: qty 0.5, 1d supply, fill #0

## 2024-04-24 ENCOUNTER — Other Ambulatory Visit: Payer: Self-pay | Admitting: Family Medicine

## 2024-04-30 ENCOUNTER — Ambulatory Visit (INDEPENDENT_AMBULATORY_CARE_PROVIDER_SITE_OTHER): Payer: Medicare Other

## 2024-04-30 VITALS — Ht 65.5 in | Wt 173.0 lb

## 2024-04-30 DIAGNOSIS — Z0000 Encounter for general adult medical examination without abnormal findings: Secondary | ICD-10-CM

## 2024-04-30 NOTE — Progress Notes (Signed)
 Subjective:   Nicole Long is a 73 y.o. who presents for a Medicare Wellness preventive visit.  As a reminder, Annual Wellness Visits don't include a physical exam, and some assessments may be limited, especially if this visit is performed virtually. We may recommend an in-person follow-up visit with your provider if needed.  Visit Complete: Virtual I connected with  Nicole Long on 04/30/24 by a audio enabled telemedicine application and verified that I am speaking with the correct person using two identifiers.  Patient Location: Home  Provider Location: Office/Clinic  I discussed the limitations of evaluation and management by telemedicine. The patient expressed understanding and agreed to proceed.  Vital Signs: Because this visit was a virtual/telehealth visit, some criteria may be missing or patient reported. Any vitals not documented were not able to be obtained and vitals that have been documented are patient reported.  VideoDeclined- This patient declined Librarian, academic. Therefore the visit was completed with audio only.  Persons Participating in Visit: Patient.  AWV Questionnaire: No: Patient Medicare AWV questionnaire was not completed prior to this visit.  Cardiac Risk Factors include: advanced age (>67men, >67 women);dyslipidemia;hypertension     Objective:     Today's Vitals   04/30/24 1116  Weight: 173 lb (78.5 kg)  Height: 5' 5.5" (1.664 m)   Body mass index is 28.35 kg/m.     04/30/2024   11:21 AM 04/27/2023    1:17 PM 04/14/2022   10:38 AM 10/12/2020   11:57 AM 09/04/2019   11:48 AM 11/28/2017    9:17 AM  Advanced Directives  Does Patient Have a Medical Advance Directive? No No No Yes No No  Does patient want to make changes to medical advance directive?    Yes (MAU/Ambulatory/Procedural Areas - Information given)    Would patient like information on creating a medical advance directive? No - Patient declined No - Patient  declined No - Patient declined  Yes (MAU/Ambulatory/Procedural Areas - Information given) Yes (MAU/Ambulatory/Procedural Areas - Information given)    Current Medications (verified) Outpatient Encounter Medications as of 04/30/2024  Medication Sig   amLODipine  (NORVASC ) 10 MG tablet TAKE 1 TABLET BY MOUTH EVERY DAY   Ascorbic Acid (VITAMIN C PO) Take 1 tablet by mouth daily.   cholecalciferol (VITAMIN D3) 25 MCG (1000 UNIT) tablet Take 1,000 Units by mouth daily.   hydrocortisone  2.5 % cream Apply topically 2 (two) times daily.   Multiple Vitamin (MULTIVITAMIN) tablet Take 2 tablets by mouth daily.    Turmeric (QC TUMERIC COMPLEX) 500 MG CAPS Take by mouth.   valACYclovir  (VALTREX ) 1000 MG tablet 2 tabs po q12h x 2 doses as needed for fever blisters/cold sores   VITAMIN E PO Take by mouth.   Zinc 30 MG TABS Take by mouth 3 (three) times a week.   No facility-administered encounter medications on file as of 04/30/2024.    Allergies (verified) Hydrochlorothiazide    History: Past Medical History:  Diagnosis Date   Abnormal EKG    Inverted T waves in precordial leads: unchanged since 2006--this is her baseline   Bicipital tendinitis of right shoulder 02/2017   PT   Eczema    History of vitamin D  deficiency    Normal range with high dose replacement x 12 wks Jan 2019.   Hx of adenomatous colonic polyps    Hyperlipidemia 09/2016   IFG (impaired fasting glucose) 09/2016   HbA1c 5.9%   Lipoma    Postmenopausal    White coat syndrome  without diagnosis of hypertension    Past Surgical History:  Procedure Laterality Date   BUNIONECTOMY  06/04/2009 and 2010/06/04   COLONOSCOPY  11/18/2010,12/24/2013   Hx of adenomatous colon polyps.  Several colonoscopies in [place] .  Most recent 12/24/13: no polyps.  Recall 5 yrs (Dr. Corey Dial, [facility]).   DEXA  09/27/2016   NORMAL.  Repeat 2 yrs.   GANGLION CYST EXCISION  1988   LIPOMA EXCISION  1990   POLYPECTOMY     TRANSTHORACIC ECHOCARDIOGRAM   07/04/14   Normal LV size and wall thickness, EF normal, grade I diast dysfxn   TUBAL LIGATION     Family History  Problem Relation Age of Onset   Congestive Heart Failure Mother    Stroke Sister        passed away 5 06-04-14   Breast cancer Sister    Cancer Brother    Thyroid  disease Son    Breast cancer Sister    Stroke Sister    Kidney disease Sister    Kidney disease Brother    Heart disease Brother        pacemaker   Colon cancer Neg Hx    Esophageal cancer Neg Hx    Rectal cancer Neg Hx    Stomach cancer Neg Hx    Colon polyps Neg Hx    Social History   Socioeconomic History   Marital status: Married    Spouse name: Not on file   Number of children: Not on file   Years of education: Not on file   Highest education level: Not on file  Occupational History   Occupation: Retired  Tobacco Use   Smoking status: Former    Current packs/day: 0.00    Types: Cigarettes    Quit date: 09/11/1985    Years since quitting: 38.6   Smokeless tobacco: Never  Vaping Use   Vaping status: Never Used  Substance and Sexual Activity   Alcohol use: Yes    Comment: socially--occ wine   Drug use: No   Sexual activity: Not on file  Other Topics Concern   Not on file  Social History Narrative   Relocated to [place] 2009-06-04 (has PMD in [place], [place]--Dr. Everitt Hoa).   Married, has 35 y/o son and one step grandchild.   Retired Veterinary surgeon.   Occ alc, no tob/drugs.   Social Drivers of Corporate investment banker Strain: Low Risk  (04/30/2024)   Overall Financial Resource Strain (CARDIA)    Difficulty of Paying Living Expenses: Not hard at all  Food Insecurity: No Food Insecurity (04/30/2024)   Hunger Vital Sign    Worried About Running Out of Food in the Last Year: Never true    Ran Out of Food in the Last Year: Never true  Transportation Needs: No Transportation Needs (04/30/2024)   PRAPARE - Administrator, Civil Service (Medical): No    Lack of Transportation (Non-Medical):  No  Physical Activity: Sufficiently Active (04/30/2024)   Exercise Vital Sign    Days of Exercise per Week: 3 days    Minutes of Exercise per Session: 50 min  Stress: No Stress Concern Present (04/30/2024)   [employer] of Occupational Health - Occupational Stress Questionnaire    Feeling of Stress : Only a little  Social Connections: Moderately Isolated (04/30/2024)   Social Connection and Isolation Panel [NHANES]    Frequency of Communication with Friends and Family: More than three times a week    Frequency of Social  Gatherings with Friends and Family: More than three times a week    Attends Religious Services: Never    Database administrator or Organizations: No    Attends Engineer, structural: Never    Marital Status: Married    Tobacco Counseling Counseling given: Not Answered    Clinical Intake:  Pre-visit preparation completed: Yes  Pain : No/denies pain     BMI - recorded: 28.35 Nutritional Status: BMI 25 -29 Overweight Nutritional Risks: None Diabetes: No  Lab Results  Component Value Date   HGBA1C 5.8 07/25/2023   HGBA1C 5.8 05/24/2022   HGBA1C 5.6 09/24/2020     How often do you need to have someone help you when you read instructions, pamphlets, or other written materials from your doctor or pharmacy?: 1 - Never  Interpreter Needed?: No  Information entered by :: Lamont Pilsner, LPN   Activities of Daily Living     04/30/2024   11:17 AM  In your present state of health, do you have any difficulty performing the following activities:  Hearing? 0  Vision? 0  Difficulty concentrating or making decisions? 0  Walking or climbing stairs? 0  Dressing or bathing? 0  Doing errands, shopping? 0  Preparing Food and eating ? N  Using the Toilet? N  In the past six months, have you accidently leaked urine? N  Do you have problems with loss of bowel control? N  Managing your Medications? N  Managing your Finances? N  Housekeeping or  managing your Housekeeping? N    Patient Care Team: Rodney Clamp, MD as PCP - General (Family Medicine) Elmyra Haggard, MD as Consulting Physician (Cardiology) Corinne Dicker., MD as Consulting Physician (Gastroenterology) Lajuan Pila, MD as Consulting Physician (Gastroenterology)  Indicate any recent Medical Services you may have received from other than [facility] providers in the past year (date may be approximate).     Assessment:    This is a routine wellness examination for Nicole Long.  Hearing/Vision screen Hearing Screening - Comments:: Pt denies any hearing issues  Vision Screening - Comments:: Wears rx glasses - up to date with routine eye exams with [facility]     Goals Addressed             This Visit's Progress    Patient Stated       WORKING TOWARD  CONTINUING EXERCISE        Depression Screen     04/30/2024   11:19 AM 07/25/2023   10:25 AM 04/27/2023    1:15 PM 12/23/2022    9:55 AM 05/17/2022   11:05 AM 04/14/2022   10:36 AM 11/12/2021    8:55 AM  PHQ 2/9 Scores  PHQ - 2 Score 0 0 0 0 0 0 0    Fall Risk     04/30/2024   11:22 AM 07/25/2023   10:25 AM 04/27/2023    1:18 PM 12/23/2022    9:55 AM 05/17/2022   11:05 AM  Fall Risk   Falls in the past year? 0 0 0 0 0  Number falls in past yr: 0 0 0 0 0  Injury with Fall? 0 0 0 0 0  Risk for fall due to : No Fall Risks No Fall Risks Impaired vision No Fall Risks No Fall Risks  Follow up   Falls prevention discussed      MEDICARE RISK AT HOME:  Medicare Risk at Home Any stairs in or around the home?: Yes  If so, are there any without handrails?: No Home free of loose throw rugs in walkways, pet beds, electrical cords, etc?: Yes Adequate lighting in your home to reduce risk of falls?: Yes Life alert?: No Use of a cane, walker or w/c?: No Grab bars in the bathroom?: Yes Shower chair or bench in shower?: Yes Elevated toilet seat or a handicapped toilet?: No  TIMED UP AND GO:  Was the test performed?   No  Cognitive Function: 6CIT completed        04/30/2024   11:23 AM 04/27/2023    1:19 PM 04/14/2022   10:42 AM 09/04/2019   11:50 AM  6CIT Screen  What Year? 0 points 0 points 0 points 0 points  What month? 0 points 0 points 0 points 0 points  What time? 0 points 0 points 0 points 0 points  Count back from 20 0 points 0 points 0 points 0 points  Months in reverse 0 points 0 points 0 points 0 points  Repeat phrase 0 points 0 points 0 points 0 points  Total Score 0 points 0 points 0 points 0 points    Immunizations Immunization History  Administered Date(s) Administered   Fluad Quad(high Dose 65+) 09/04/2019, 09/24/2020, 09/30/2021, 08/31/2022   Fluad Trivalent(High Dose 65+) 08/22/2023   Influenza Split 09/10/2008, 10/06/2009   Influenza, High Dose Seasonal PF 09/13/2016, 10/04/2017, 10/30/2018   Influenza, Seasonal, Injecte, Preservative Fre 09/24/2014   Influenza,inj,Quad PF,6+ Mos 09/11/2013   Influenza,inj,quad, With Preservative 12/09/2015   PFIZER Comirnaty (Gray Top)Covid-19 Tri-Sucrose Vaccine 04/30/2021   PFIZER(Purple Top)SARS-COV-2 Vaccination 01/16/2020, 02/10/2020, 09/26/2020   Pfizer Covid-19 Vaccine Bivalent Booster 41yrs & up 09/09/2021   Pfizer(Comirnaty )Fall Seasonal Vaccine 12 years and older 10/07/2022, 09/05/2023   Pneumococcal Conjugate-13 04/19/2018   Pneumococcal Polysaccharide-23 09/04/2019   Rsv, Bivalent, Protein Subunit Rsvpref,pf (Abrysvo ) 01/16/2024   Tdap 09/11/2013, 02/20/2024   Varicella 03/06/2014   Zoster Recombinant(Shingrix ) 03/15/2022, 06/17/2022    Screening Tests Health Maintenance  Topic Date Due   COVID-19 Vaccine (8 - 2024-25 season) 03/04/2024   INFLUENZA VACCINE  07/12/2024   Medicare Annual Wellness (AWV)  04/30/2025   MAMMOGRAM  02/19/2026   Colonoscopy  10/24/2026   DTaP/Tdap/Td (3 - Td or Tdap) 02/19/2034   Pneumonia Vaccine 53+ Years old  Completed   DEXA SCAN  Completed   Hepatitis C Screening  Completed   Zoster  Vaccines- Shingrix   Completed   HPV VACCINES  Aged Out   Meningococcal B Vaccine  Aged Out    Health Maintenance  Health Maintenance Due  Topic Date Due   COVID-19 Vaccine (8 - 2024-25 season) 03/04/2024   Health Maintenance Items Addressed: See Nurse Notes  Additional Screening:  Vision Screening: Recommended annual ophthalmology exams for early detection of glaucoma and other disorders of the eye.  Dental Screening: Recommended annual dental exams for proper oral hygiene  Community Resource Referral / Chronic Care Management: CRR required this visit?  No   CCM required this visit?  No   Plan:    I have personally reviewed and noted the following in the patient's chart:   Medical and social history Use of alcohol, tobacco or illicit drugs  Current medications and supplements including opioid prescriptions. Patient is not currently taking opioid prescriptions. Functional ability and status Nutritional status Physical activity Advanced directives List of other physicians Hospitalizations, surgeries, and ER visits in previous 12 months Vitals Screenings to include cognitive, depression, and falls Referrals and appointments  In addition, I have reviewed and discussed  with patient certain preventive protocols, quality metrics, and best practice recommendations. A written personalized care plan for preventive services as well as general preventive health recommendations were provided to patient.   Bruno Capri, LPN   7/62/8315   After Visit Summary: (Mail) Due to this being a telephonic visit, the after visit summary with patients personalized plan was offered to patient via mail   Notes: Nothing significant to report at this time.

## 2024-04-30 NOTE — Patient Instructions (Signed)
 Nicole Long , Thank you for taking time out of your busy schedule to complete your Annual Wellness Visit with me. I enjoyed our conversation and look forward to speaking with you again next year. I, as well as your care team,  appreciate your ongoing commitment to your health goals. Please review the following plan we discussed and let me know if I can assist you in the future. Your Game plan/ To Do List    Referrals: If you haven't heard from the office you've been referred to, please reach out to them at the phone provided.   Follow up Visits: Next Medicare AWV with our clinical staff: 05/08/25   Have you seen your provider in the last 6 months (3 months if uncontrolled diabetes)? Yes Next Office Visit with your provider: 08/23/24  Clinician Recommendations:  Aim for 30 minutes of exercise or brisk walking, 6-8 glasses of water, and 5 servings of fruits and vegetables each day.       This is a list of the screening recommended for you and due dates:  Health Maintenance  Topic Date Due   COVID-19 Vaccine (8 - 2024-25 season) 03/04/2024   Flu Shot  07/12/2024   Medicare Annual Wellness Visit  04/30/2025   Mammogram  02/19/2026   Colon Cancer Screening  10/24/2026   DTaP/Tdap/Td vaccine (3 - Td or Tdap) 02/19/2034   Pneumonia Vaccine  Completed   DEXA scan (bone density measurement)  Completed   Hepatitis C Screening  Completed   Zoster (Shingles) Vaccine  Completed   HPV Vaccine  Aged Out   Meningitis B Vaccine  Aged Out    Advanced directives: (Declined) Advance directive discussed with you today. Even though you declined this today, please call our office should you change your mind, and we can give you the proper paperwork for you to fill out. Advance Care Planning is important because it:  [x]  Makes sure you receive the medical care that is consistent with your values, goals, and preferences  [x]  It provides guidance to your family and loved ones and reduces their decisional  burden about whether or not they are making the right decisions based on your wishes.  Follow the link provided in your after visit summary or read over the paperwork we have mailed to you to help you started getting your Advance Directives in place. If you need assistance in completing these, please reach out to us  so that we can help you!  See attachments for Preventive Care and Fall Prevention Tips.

## 2024-07-21 ENCOUNTER — Other Ambulatory Visit: Payer: Self-pay | Admitting: Family Medicine

## 2024-08-23 ENCOUNTER — Ambulatory Visit: Payer: Medicare Other | Admitting: Family Medicine

## 2024-09-03 ENCOUNTER — Other Ambulatory Visit (HOSPITAL_BASED_OUTPATIENT_CLINIC_OR_DEPARTMENT_OTHER): Payer: Self-pay

## 2024-09-03 DIAGNOSIS — Z23 Encounter for immunization: Secondary | ICD-10-CM | POA: Diagnosis not present

## 2024-09-03 MED ORDER — COMIRNATY 30 MCG/0.3ML IM SUSY
0.3000 mL | PREFILLED_SYRINGE | Freq: Once | INTRAMUSCULAR | 0 refills | Status: AC
  Filled 2024-09-03: qty 0.3, 1d supply, fill #0

## 2024-09-19 DIAGNOSIS — H25813 Combined forms of age-related cataract, bilateral: Secondary | ICD-10-CM | POA: Diagnosis not present

## 2024-09-19 DIAGNOSIS — H35373 Puckering of macula, bilateral: Secondary | ICD-10-CM | POA: Diagnosis not present

## 2024-09-19 LAB — OPHTHALMOLOGY REPORT-SCANNED

## 2024-09-25 ENCOUNTER — Encounter: Payer: Self-pay | Admitting: Family Medicine

## 2024-09-25 ENCOUNTER — Ambulatory Visit: Admitting: Family Medicine

## 2024-09-25 VITALS — BP 142/78 | HR 95 | Temp 97.3°F | Ht 65.5 in | Wt 174.0 lb

## 2024-09-25 DIAGNOSIS — E785 Hyperlipidemia, unspecified: Secondary | ICD-10-CM

## 2024-09-25 DIAGNOSIS — Z23 Encounter for immunization: Secondary | ICD-10-CM | POA: Diagnosis not present

## 2024-09-25 DIAGNOSIS — I10 Essential (primary) hypertension: Secondary | ICD-10-CM

## 2024-09-25 DIAGNOSIS — L918 Other hypertrophic disorders of the skin: Secondary | ICD-10-CM | POA: Diagnosis not present

## 2024-09-25 DIAGNOSIS — E559 Vitamin D deficiency, unspecified: Secondary | ICD-10-CM

## 2024-09-25 DIAGNOSIS — R7303 Prediabetes: Secondary | ICD-10-CM

## 2024-09-25 DIAGNOSIS — B001 Herpesviral vesicular dermatitis: Secondary | ICD-10-CM | POA: Diagnosis not present

## 2024-09-25 LAB — LIPID PANEL
Cholesterol: 205 mg/dL — ABNORMAL HIGH (ref 0–200)
HDL: 66.3 mg/dL (ref >39.00)
LDL Cholesterol: 123 mg/dL — ABNORMAL HIGH (ref 0–99)
NonHDL: 138.85
Total CHOL/HDL Ratio: 3
Triglycerides: 79 mg/dL (ref 0.0–149.0)
VLDL: 15.8 mg/dL (ref 0.0–40.0)

## 2024-09-25 LAB — COMPREHENSIVE METABOLIC PANEL WITH GFR
ALT: 13 U/L (ref 0–35)
AST: 20 U/L (ref 0–37)
Albumin: 4.7 g/dL (ref 3.5–5.2)
Alkaline Phosphatase: 82 U/L (ref 39–117)
BUN: 12 mg/dL (ref 6–23)
CO2: 26 meq/L (ref 19–32)
Calcium: 9.5 mg/dL (ref 8.4–10.5)
Chloride: 103 meq/L (ref 96–112)
Creatinine, Ser: 0.73 mg/dL (ref 0.40–1.20)
GFR: 81.5 mL/min (ref >60.00)
Glucose, Bld: 95 mg/dL (ref 70–99)
Potassium: 4 meq/L (ref 3.5–5.1)
Sodium: 141 meq/L (ref 135–145)
Total Bilirubin: 0.9 mg/dL (ref 0.2–1.2)
Total Protein: 7.6 g/dL (ref 6.0–8.3)

## 2024-09-25 LAB — CBC
HCT: 39 % (ref 36.0–46.0)
Hemoglobin: 12.9 g/dL (ref 12.0–15.0)
MCHC: 33.2 g/dL (ref 30.0–36.0)
MCV: 93.7 fl (ref 78.0–100.0)
Platelets: 259 K/uL (ref 150.0–400.0)
RBC: 4.16 Mil/uL (ref 3.87–5.11)
RDW: 14 % (ref 11.5–15.5)
WBC: 4.3 K/uL (ref 4.0–10.5)

## 2024-09-25 LAB — HEMOGLOBIN A1C: Hgb A1c MFr Bld: 6 % (ref 4.6–6.5)

## 2024-09-25 LAB — TSH: TSH: 2 u[IU]/mL (ref 0.35–5.50)

## 2024-09-25 LAB — VITAMIN D 25 HYDROXY (VIT D DEFICIENCY, FRACTURES): VITD: 60.21 ng/mL (ref 30.00–100.00)

## 2024-09-25 NOTE — Assessment & Plan Note (Signed)
Check vitamin D  labs

## 2024-09-25 NOTE — Progress Notes (Addendum)
 Nicole Long is a 73 y.o. female who presents today for an office visit.  Assessment/Plan:  New/Acute Problems: Inflamed Skin Tag Cryotherapy applied today.  See below procedure note.  She tolerated well.  Assessment and Plan Assessment & Plan Leg Pain, improving   Her Leg Pain is improving with the use of heat, ice, and Tylenol. She continues to use patches for relief and will maintain current management. She should contact Doctor Joane if the condition worsens.      Chronic Problems Addressed Today: Vitamin D  deficiency Check vitamin D  labs.  Prediabetes Check A1c with labs.  Discussed lifestyle modifications.  Dyslipidemia Check lipids.  Discussed lifestyle modifications.  She would like to avoid statins.  Cold sore Stable without treks as needed.  Does not need refill today.  HTN (hypertension), benign Mildly elevated today though typically well-controlled on amlodipine  10 mg daily.  She will monitor at home and let us  know if persistently elevated.  Check labs today.  Preventative health care Flu shot given today.  Check labs.    Subjective:  HPI:  See assessment / plan for status of chronic conditions.   Discussed the use of AI scribe software for clinical note transcription with the patient, who gave verbal consent to proceed.  History of Present Illness Nicole Long is a 73 year old female who presents with eye health concerns and leg pain.  She has been informed by her eye doctor that she is borderline for cataracts and may require surgery in six months. A follow-up appointment is scheduled for April 8th to assess the need for surgery. She has been referred to [facility] for further evaluation.  She describes experiencing a hamstring strain in her leg, which she cannot pinpoint the exact cause of. She suspects it may have been due to exercise or prolonged sitting during a car trip in September. She has been using heat,  ice, and Tylenol for management, and reports improvement in her symptoms. Initially, she was 'hardly able to walk' but is now feeling better. She also mentions using patches for relief.  She mentions having a mole on her back that she would like to be examined.  She discusses her diet and exercise routine, noting that she has been avoiding sweets and is working on reducing sugar intake. She exercises at home using three machines and performs therapy exercises for her upper body. She also mentions doing chair yoga during the summer.  She attributes her elevated blood pressure to poor sleep the previous night. She monitors her blood pressure at home and varies the timing of her medication intake within an hour each day.  Her current medications include cortisone cream and Valtrex , with five refills available for each. She has not needed to use Valtrex  frequently.  She has received her COVID vaccine and is up to date on other vaccines, including shingles, pneumonia, and tetanus. She is working on managing her cholesterol through diet, focusing on increasing fiber intake and following a Mediterranean diet.         Objective:  Physical Exam: BP (!) 142/78   Pulse 95   Temp (!) 97.3 F (36.3 C) (Temporal)   Ht 5' 5.5 (1.664 m)   Wt 174 lb (78.9 kg)   LMP  (LMP Unknown)   SpO2 97%   BMI 28.51 kg/m   Wt Readings from Last 3 Encounters:  09/25/24 174 lb (78.9 kg)  04/30/24 173 lb (78.5 kg)  02/08/24 173 lb  3.2 oz (78.6 kg)    Gen: No acute distress, resting comfortably CV: Regular rate and rhythm with no murmurs appreciated Pulm: Normal work of breathing, clear to auscultation bilaterally with no crackles, wheezes, or rhonchi Skin: Inflamed skin tag noted right lower back. Neuro: Grossly normal, moves all extremities Psych: Normal affect and thought content   Cryotherapy Procedure Note  Pre-operative Diagnosis: Inflamed skin tag  Locations: Right lower back  Indications:  Therapeutic  Procedure Details  Patient informed of risks (permanent scarring, infection, light or dark discoloration, bleeding, infection, weakness, numbness and recurrence of the lesion) and benefits of the procedure and verbal informed consent obtained.  The areas are treated with liquid nitrogen therapy, frozen until ice ball extended 3 mm beyond lesion, allowed to thaw, and treated again. The patient tolerated procedure well.  The patient was instructed on post-op care, warned that there may be blister formation, redness and pain. Recommend OTC analgesia as needed for pain.  Condition: Stable  Complications: none.      Worth HERO. Kennyth, MD 09/27/2024 11:31 AM

## 2024-09-25 NOTE — Assessment & Plan Note (Signed)
 Stable without treks as needed.  Does not need refill today.

## 2024-09-25 NOTE — Assessment & Plan Note (Signed)
 Mildly elevated today though typically well-controlled on amlodipine  10 mg daily.  She will monitor at home and let us  know if persistently elevated.  Check labs today.

## 2024-09-25 NOTE — Patient Instructions (Signed)
 It was very nice to see you today!  VISIT SUMMARY: During your visit, we discussed your eye health, leg pain, and overall wellness. We addressed your borderline cataracts, improving hamstring strain, and slightly elevated blood pressure. We also removed a skin tag and reviewed your current medications and lifestyle habits.  YOUR PLAN: HAMSTRING STRAIN: You have been experiencing a hamstring strain in your leg, which has been improving with your current management. -Continue using heat, ice, and Tylenol for relief. -You can also continue using patches for relief. -Contact Doctor Joane if your condition worsens.  SKIN TAG REMOVAL: A skin tag on your back was removed during the visit. -No further action is needed unless you notice any issues at the removal site.  HYPERTENSION: Your blood pressure was slightly elevated during the visit, possibly due to poor sleep and stress. -Continue monitoring your blood pressure at home. -Your blood pressure will be checked again before you leave today.  DYSLIPIDEMIA: You are managing your cholesterol through dietary changes. -Blood work will be performed to update your cholesterol levels. -Continue focusing on increasing fiber intake and following a Mediterranean diet.  COLD SORE (HERPES LABIALIS): Your cold sore is managed with Valtrex  as needed. -Continue using Valtrex  as needed. -Contact the office if you need additional medication.  GENERAL HEALTH MAINTENANCE: Routine wellness visit and vaccinations. -Blood work will be performed to check cholesterol, blood sugar, thyroid , kidney, liver, and blood counts. -You will receive a flu shot today.  Return in about 6 months (around 03/26/2025).   Take care, Dr Kennyth  PLEASE NOTE:  If you had any lab tests, please let us  know if you have not heard back within a few days. You may see your results on mychart before we have a chance to review them but we will give you a call once they are reviewed by us .    If we ordered any referrals today, please let us  know if you have not heard from their office within the next week.   If you had any urgent prescriptions sent in today, please check with the pharmacy within an hour of our visit to make sure the prescription was transmitted appropriately.   Please try these tips to maintain a healthy lifestyle:  Eat at least 3 REAL meals and 1-2 snacks per day.  Aim for no more than 5 hours between eating.  If you eat breakfast, please do so within one hour of getting up.   Each meal should contain half fruits/vegetables, one quarter protein, and one quarter carbs (no bigger than a computer mouse)  Cut down on sweet beverages. This includes juice, soda, and sweet tea.   Drink at least 1 glass of water with each meal and aim for at least 8 glasses per day  Exercise at least 150 minutes every week.

## 2024-09-25 NOTE — Assessment & Plan Note (Signed)
 Check A1c with labs.  Discussed lifestyle modifications.

## 2024-09-25 NOTE — Assessment & Plan Note (Signed)
 Check lipids.  Discussed lifestyle modifications.  She would like to avoid statins.

## 2024-09-27 ENCOUNTER — Ambulatory Visit: Payer: Self-pay | Admitting: Family Medicine

## 2024-09-27 DIAGNOSIS — E785 Hyperlipidemia, unspecified: Secondary | ICD-10-CM

## 2024-09-27 NOTE — Progress Notes (Signed)
 Her cholesterol is a little elevated and better than last year.  Her A1c is also borderline elevated but similar to last year.  All of her other labs are at goal.  I know she would like to avoid statin medications.  Given the improvement in her numbers it is okay for her to continue to work on diet and exercise for now however if she changes her mind we can send in Lipitor 40 mg daily.  Everything else is at goal and we can recheck in a year.

## 2024-10-20 ENCOUNTER — Other Ambulatory Visit: Payer: Self-pay | Admitting: Family Medicine

## 2025-03-25 ENCOUNTER — Ambulatory Visit: Admitting: Family Medicine

## 2025-05-08 ENCOUNTER — Ambulatory Visit
